# Patient Record
Sex: Male | Born: 1990 | Race: White | Hispanic: No | Marital: Married | Smoking: Never smoker
Health system: Southern US, Community
[De-identification: ages and names within clinical notes are randomized; demographics above are authoritative.]

## PROBLEM LIST (undated history)

## (undated) DIAGNOSIS — Q2112 Patent foramen ovale: Secondary | ICD-10-CM

## (undated) DIAGNOSIS — Q231 Congenital insufficiency of aortic valve: Secondary | ICD-10-CM

## (undated) DIAGNOSIS — F909 Attention-deficit hyperactivity disorder, unspecified type: Secondary | ICD-10-CM

## (undated) DIAGNOSIS — K219 Gastro-esophageal reflux disease without esophagitis: Secondary | ICD-10-CM

## (undated) DIAGNOSIS — F419 Anxiety disorder, unspecified: Secondary | ICD-10-CM

## (undated) DIAGNOSIS — R55 Syncope and collapse: Secondary | ICD-10-CM

## (undated) DIAGNOSIS — R011 Cardiac murmur, unspecified: Secondary | ICD-10-CM

## (undated) DIAGNOSIS — Q211 Atrial septal defect: Secondary | ICD-10-CM

## (undated) DIAGNOSIS — T7840XA Allergy, unspecified, initial encounter: Secondary | ICD-10-CM

## (undated) DIAGNOSIS — Q2381 Bicuspid aortic valve: Secondary | ICD-10-CM

## (undated) DIAGNOSIS — F32A Depression, unspecified: Secondary | ICD-10-CM

## (undated) HISTORY — DX: Bicuspid aortic valve: Q23.81

## (undated) HISTORY — DX: Atrial septal defect: Q21.1

## (undated) HISTORY — DX: Anxiety disorder, unspecified: F41.9

## (undated) HISTORY — DX: Syncope and collapse: R55

## (undated) HISTORY — DX: Gastro-esophageal reflux disease without esophagitis: K21.9

## (undated) HISTORY — PX: APPENDECTOMY: SHX54

## (undated) HISTORY — DX: Patent foramen ovale: Q21.12

## (undated) HISTORY — DX: Cardiac murmur, unspecified: R01.1

## (undated) HISTORY — DX: Allergy, unspecified, initial encounter: T78.40XA

## (undated) HISTORY — PX: EYE SURGERY: SHX253

## (undated) HISTORY — DX: Congenital insufficiency of aortic valve: Q23.1

---
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
## 2011-10-17 ENCOUNTER — Encounter: Payer: Self-pay | Admitting: Cardiology

## 2011-10-26 ENCOUNTER — Encounter: Payer: Self-pay | Admitting: Cardiology

## 2011-12-24 ENCOUNTER — Institutional Professional Consult (permissible substitution): Payer: Self-pay | Admitting: Cardiovascular Disease

## 2012-02-01 ENCOUNTER — Institutional Professional Consult (permissible substitution): Payer: Self-pay | Admitting: Cardiovascular Disease

## 2012-03-17 ENCOUNTER — Ambulatory Visit (INDEPENDENT_AMBULATORY_CARE_PROVIDER_SITE_OTHER): Payer: Self-pay | Admitting: Cardiology

## 2012-03-17 ENCOUNTER — Encounter: Payer: Self-pay | Admitting: Cardiology

## 2012-03-17 VITALS — BP 110/60 | HR 92 | Ht 69.0 in | Wt 137.0 lb

## 2012-03-17 DIAGNOSIS — R55 Syncope and collapse: Secondary | ICD-10-CM | POA: Insufficient documentation

## 2012-03-17 MED ORDER — METOPROLOL TARTRATE 25 MG PO TABS: 12.5000 mg | ORAL_TABLET | Freq: Two times a day (BID) | ORAL | Status: DC

## 2012-03-17 NOTE — Progress Notes (Signed)
Austin Vargas Date of Birth: 1991/07/01 Medical Record #[id number]  History of Present Illness: Austin Vargas is seen for a second opinion. He is a pleasant 21 year old white male who has a history of dizziness and recurrent syncope. His initial episode of syncope occurred while he was sitting in a classroom. He felt a tingling sensation in the back of his neck. This lasted for 20-30 minutes. He then developed a sense that he was "off". He had some mild lightheadedness. He then had a syncopal episode hitting his head on the desk and winding up on the floor. He was told that he had a concussion related to this fall. Within the same week he was at church. He was standing singing and felt a similar sensation. He sat down in his pew but then passed out. This was witnessed by his family. Since then he has had further episodes of lightheadedness without true syncope. He complains of feeling drained. He has been able to participate in some sports but does find that the longer he exercises or the hotter he gets the more lightheaded he feels. He has liberalized the salt in his diet and has focused on good hydration. He underwent extensive evaluation by [facility]. This included an ECG, Holter monitor, echocardiogram, cranial CT scan, and tilt table testing. His echocardiogram was remarkable for a PFO by bubble study. He also appeared to have a bicuspid aortic valve but had no significant gradient. His Holter monitor showed no significant arrhythmias. His tilt table test was reported as being markedly abnormal and consistent with neurocardiogenic syncope but those results are not available today. Patient was initially started on low-dose Paxil but did not tolerate this well with resultant hallucinations. He was then tried on midodrine without any improvement in symptoms and with the side effect of headaches. He stop taking this medication and one week ago.  Current Outpatient Prescriptions on File  Prior to Visit  Medication Sig Dispense Refill  . metoprolol tartrate (LOPRESSOR) 25 MG tablet Take 0.5 tablets (12.5 mg total) by mouth 2 (two) times daily.  30 tablet  11    Allergies  Allergen Reactions  . Paxil (Paroxetine Hcl)     Past Medical History  Diagnosis Date  . Lightheadedness   . Fainting 1/28 and 10/15/2011  . Neurologic cardiac syncope     Past Surgical History  Procedure Date  . Appendectomy   . Eye surgery     Both eyes    History  Smoking status  . Never Smoker   Smokeless tobacco  . Not on file    History  Alcohol Use No    History reviewed. No pertinent family history.  Review of Systems: As noted in history of present illness.  All other systems were reviewed and are negative.  Physical Exam: BP 110/60  Pulse 92  Ht 5\' 9"  (1.753 m)  Wt 62.143 kg (137 lb)  BMI 20.23 kg/m2  SpO2 99% Orthostatic vital signs reveal a blood pressure of 112/62 and pulse of 80 supine, blood pressure 116/70 and pulse of 88 sitting, and blood pressure 114/80 with pulse of 100 standing. He is a young, thin white male in no acute distress.The patient is alert and oriented x 3.  The mood and affect are normal.  The skin is warm and dry.  Color is normal.  The HEENT exam reveals that the sclera are nonicteric.  The mucous membranes are moist.  The carotids are 2+ without bruits.  There is no  thyromegaly.  There is no JVD.  The lungs are clear.  The chest wall is non tender.  The heart exam reveals a regular rate with a normal S1 and S2.  There are no murmurs, gallops, or rubs.  The PMI is not displaced.   Abdominal exam reveals good bowel sounds.  There is no guarding or rebound.  There is no hepatosplenomegaly or tenderness.  There are no masses.  Exam of the legs reveal no clubbing, cyanosis, or edema.  The legs are without rashes.  The distal pulses are intact.  Cranial nerves II - XII are intact.  Motor and sensory functions are intact.  The gait is normal.  LABORATORY  DATA: ECG demonstrates normal sinus rhythm with marked sinus arrhythmia. It is otherwise normal.  Assessment / Plan:

## 2012-03-17 NOTE — Patient Instructions (Signed)
Continue your current prevention measures- hydration, liberalize salt intake, early recognition  We will try you on metoprolol 12.5 mg twice a day.  We will schedule you to see Dr. Graciela Husbands to follow up.

## 2012-03-17 NOTE — Assessment & Plan Note (Signed)
His symptom complex is certainly consistent with neurocardiogenic syncope and his tilt table test was apparently consistent with this. We discussed the pathophysiology of this syndrome. He has failed treatment with Paxil and midodrine. I requested a complete copy of his tilt table test. He is to continue his avoidance measures including hydration, liberalization assault, and earlier a condition of symptoms. I recommended a trial of low-dose beta blocker therapy and we'll start him on metoprolol 12.5 mg twice daily. I will have him follow up with Dr. Graciela Husbands who has a particular interest in this disease process.

## 2012-04-19 ENCOUNTER — Telehealth: Payer: Self-pay | Admitting: Cardiology

## 2012-04-19 NOTE — Telephone Encounter (Signed)
Pt had to change appt and move it to October to see Dr. Graciela Husbands due to school schedule and wanted to make sure that if he needs more meds prior to appt will Dr. Swaziland get it called in for him

## 2012-04-20 NOTE — Telephone Encounter (Signed)
Spoke to patient's mother was told if patient has any problems and needs any refills she can call me.Stated since son started on new medicine he is doing better.

## 2012-05-11 ENCOUNTER — Ambulatory Visit: Payer: 59 | Admitting: Internal Medicine

## 2012-05-31 ENCOUNTER — Encounter: Payer: Self-pay | Admitting: Physician Assistant

## 2012-05-31 ENCOUNTER — Ambulatory Visit (INDEPENDENT_AMBULATORY_CARE_PROVIDER_SITE_OTHER): Payer: 59 | Admitting: Physician Assistant

## 2012-05-31 ENCOUNTER — Telehealth: Payer: Self-pay | Admitting: *Deleted

## 2012-05-31 VITALS — BP 134/86 | HR 103 | Ht 70.0 in | Wt 138.8 lb

## 2012-05-31 DIAGNOSIS — R55 Syncope and collapse: Secondary | ICD-10-CM

## 2012-05-31 MED ORDER — PROPRANOLOL HCL 10 MG PO TABS: 10.0000 mg | ORAL_TABLET | Freq: Three times a day (TID) | ORAL | Status: DC

## 2012-05-31 MED ORDER — ATENOLOL 25 MG PO TABS: 12.5000 mg | ORAL_TABLET | Freq: Every day | ORAL | Status: DC

## 2012-05-31 MED ORDER — NADOLOL 20 MG PO TABS: 10.0000 mg | ORAL_TABLET | Freq: Every day | ORAL | Status: DC

## 2012-05-31 NOTE — Telephone Encounter (Signed)
T/W pharmicist only suppose to give one beta blocker at a time pharmicist verbally understood and noted it chart

## 2012-05-31 NOTE — Patient Instructions (Addendum)
Take metoprolol tonight    Then start with one of these prescriptions at a time and see which one works better for you; call back with which one makes you feel better so we can send in a prescription Your physician has recommended you make the following change in your medication:  1.) ATENOLOL (25 MG) TAKE ONE HALF TABLET (12.5 MG) DAILY FOR TWO WEEKS 2.) PROPRANOLOL (10MG )  TAKE ONE TABLET 3 TIMES DAILY FOR TWO WEEKS 3.) NADOLOL (20 MG) TAKE ONE HALF TABLET (10 MG) DAILY FOR TWO WEEKS   Your physician recommends that you KEEP follow-up appointment WITH DR. Graciela Husbands ON 06/23/2012

## 2012-05-31 NOTE — Progress Notes (Signed)
[address] [place], [place]  [address] Phone: [phone] Fax:  [phone]  Date:  05/31/2012   Name:  Austin Vargas   DOB:  05/13/1991   MRN:  [id number]  PCP:  No primary provider on file.  Primary Cardiologist:  Dr. Peter Swaziland  Primary Electrophysiologist:  None - Has appt scheduled with Dr. Sherryl Manges in 06/2012   History of Present Illness: Austin Vargas is a 21 y.o. male who returns for evaluation of dizziness.  He has a history of dizziness and recurrent syncope. He saw Dr. Peter Swaziland in 7/13 for second opinion evaluation.  He underwent extensive evaluation by [facility]. This included an ECG, Holter monitor, echocardiogram, cranial CT scan, and tilt table testing. His echocardiogram was remarkable for a PFO by bubble study. He also appeared to have a bicuspid aortic valve but had no significant gradient. His Holter monitor showed no significant arrhythmias. His tilt table test was reported as being markedly abnormal and consistent with neurocardiogenic syncope but those results have not been available. Patient was initially started on low-dose Paxil but did not tolerate this well with resultant hallucinations. He was then tried on midodrine without any improvement in symptoms and with the side effect of headaches. He stopped taking this medication.  Dr. Swaziland discussed hydration and liberalization of salt and a trial of a low dose beta blocker (Metoprolol 12.5 mg bid).  He also referred him to Dr. Sherryl Manges for further evaluation and recommendations.  Over the weekend, he developed lightheadedness shortly after getting out of a hot shower.  He felt near syncopal but did not pass out.  He felt rapid palpitations. He laid down for almost an hour before feeling better. He denies chest pain or shortness of breath. Of note, over the last couple days he has had symptoms of a viral illness. He's had diarrhea and an upset  stomach. He denies fevers, cough, sore throat or otalgia.  Past Medical History  Diagnosis Date  . Neurologic cardiac syncope     He underwent extensive evaluation by [facility]. This included an ECG, Holter monitor, echocardiogram, cranial CT scan, and tilt table testing. His echocardiogram was remarkable for a PFO by bubble study. He also appeared to have a bicuspid aortic valve but had no significant gradient.  Holter neg for significant arrhythmias. Tilt test + for neurocardiogenic syncope    Current Outpatient Prescriptions  Medication Sig Dispense Refill  . metoprolol tartrate (LOPRESSOR) 25 MG tablet Take 0.5 tablets (12.5 mg total) by mouth 2 (two) times daily.  30 tablet  11  . Multiple Vitamin (MULTIVITAMIN) tablet Take 1 tablet by mouth daily.        Allergies: Allergies  Allergen Reactions  . Paxil (Paroxetine Hcl)     History  Substance Use Topics  . Smoking status: Never Smoker   . Smokeless tobacco: Not on file  . Alcohol Use: No     ROS:  Please see the history of present illness.   He notes problems with erectile dysfunction since starting metoprolol.  All other systems reviewed and negative.   PHYSICAL EXAM: VS:  BP 114/69  Pulse 83  Ht 5\' 10"  (1.778 m)  Wt 138 lb 12.8 oz (62.959 kg)  BMI 19.92 kg/m2   Filed Vitals:   05/31/12 0926 05/31/12 0927 05/31/12 0928 05/31/12 0929  BP: 123/77 127/75 124/85 134/86  Pulse: 88 95 101 103  Height:      Weight:  Well nourished, well developed, in no acute distress HEENT: normal Neck: no JVD Endo: no thyromegaly  Cardiac:  normal S1, S2; RRR; no murmur Lungs:  clear to auscultation bilaterally, no wheezing, rhonchi or rales Abd: soft, nontender, no hepatomegaly Ext: no edema Skin: warm and dry Neuro:  CNs 2-12 intact, no focal abnormalities noted  EKG:  Sinus rhythm, heart rate 77, rightward axis, PACs      ASSESSMENT AND PLAN:  1. Neurocardiogenic Syncope:  I believe that  his episode this past weekend was likely exacerbated by his recent viral illness. We discussed the nature of his problem. He probably became somewhat dehydrated with the diarrhea which caused his symptoms to worsen. Overall, his symptoms have been better since he has been on metoprolol. However, he is somewhat troubled by erectile dysfunction. I have given him the option of a beta blocker trial with 3 different drugs. I have given him atenolol 25 mg half tablet daily, nadolol 20 mg half a tablet daily and propranolol 10 mg 3 times a day. We discussed how to try these. He did ask about whether or not he should have a handicap sticker. I told him that this would not be a very good idea. He also asked about medications to treat erectile dysfunction. I explained that finding a beta blocker that works without the side effects would be a better option. He already has a followup with Dr. Graciela Husbands in October and he should keep that appointment.  Luna Glasgow, PA-C  8:41 AM 05/31/2012

## 2012-06-13 ENCOUNTER — Other Ambulatory Visit: Payer: Self-pay | Admitting: Cardiology

## 2012-06-13 DIAGNOSIS — R55 Syncope and collapse: Secondary | ICD-10-CM

## 2012-06-13 NOTE — Telephone Encounter (Signed)
Pt needs refill for 1 month supply

## 2012-06-14 ENCOUNTER — Other Ambulatory Visit: Payer: Self-pay | Admitting: *Deleted

## 2012-06-14 ENCOUNTER — Telehealth: Payer: Self-pay | Admitting: Cardiology

## 2012-06-14 ENCOUNTER — Other Ambulatory Visit: Payer: Self-pay | Admitting: Cardiology

## 2012-06-14 DIAGNOSIS — R55 Syncope and collapse: Secondary | ICD-10-CM

## 2012-06-14 MED ORDER — ATENOLOL 25 MG PO TABS: 12.5000 mg | ORAL_TABLET | Freq: Every day | ORAL | Status: DC

## 2012-06-14 NOTE — Telephone Encounter (Signed)
Spoke with pt who has decided atenolol 25mg  1/2 tab qd--#15 with 12 refills sent to pharmacy

## 2012-06-14 NOTE — Telephone Encounter (Signed)
Pt is completely out of medication and this is the 3rd call for a refill

## 2012-06-14 NOTE — Telephone Encounter (Signed)
Pt out of atenolol and needs called into [facility] [place] asap

## 2012-06-14 NOTE — Telephone Encounter (Signed)
Received call from patient's mother stating patient likes atenolol it is working.States he needs a refill.Refill sent to [facility] [address] [place].

## 2012-06-23 ENCOUNTER — Encounter: Payer: Self-pay | Admitting: Internal Medicine

## 2012-06-23 ENCOUNTER — Ambulatory Visit (INDEPENDENT_AMBULATORY_CARE_PROVIDER_SITE_OTHER): Payer: 59 | Admitting: Internal Medicine

## 2012-06-23 VITALS — BP 110/72 | HR 72 | Resp 16 | Ht 69.0 in | Wt 138.0 lb

## 2012-06-23 DIAGNOSIS — R55 Syncope and collapse: Secondary | ICD-10-CM

## 2012-06-23 NOTE — Patient Instructions (Addendum)
Your physician recommends that you schedule a follow-up appointment in: 3 months with Dr Klein  

## 2012-06-23 NOTE — Progress Notes (Signed)
ELECTROPHYSIOLOGY CONSULT NOTE  Patient ID: Austin Vargas, MRN: [id number], DOB/AGE: 04-02-1991 20 y.o. Admit date: (Not on file) Date of Consult: 06/23/2012  Primary Physician: Nadean Corwin, MD Primary Cardiologist:  PJ    Chief Complaint  HPI Austin Vargas is a 21 y.o. male  Seen at the request of Dr. Garth Bigness because of a history of syncope diagnosed at [facility] as neurocardiogenic. This is based on a tilt table test associated with cardio inhibitory/vasopressor signs. Therapy was initiated with Paxil and then metoprolol was taken; he is currently on low-dose atenolol and feeling some better.  The symptoms started abruptly in February. They're associated with a typical prodrome of flushing diaphoresis and some nausea accompanied by lightheadedness and preceded by tingling in his shoulder and his neck. He is become aware of the duration of the prodrome lasting minutes. Initially he was unaware of same and had a number of episodes of loss of consciousness.  He is found that he is to shower intolerance, heat intolerance, he is some orthostatic intolerance and his symptoms are worse in the morning.  His diet is salt and water replete.   he denies use of alcohol or recreational drugs.  He no longer smokes    Past Medical History  Diagnosis Date  . Neurologic cardiac syncope     He underwent extensive evaluation by [facility]. This included an ECG, Holter monitor, echocardiogram, cranial CT scan, and tilt table testing. His echocardiogram was remarkable for a PFO by bubble study. He also appeared to have a bicuspid aortic valve but had no significant gradient.  Holter neg for significant arrhythmias. Tilt test + for neurocardiogenic syncope      Surgical History:  Past Surgical History  Procedure Date  . Appendectomy   . Eye surgery     Both eyes     Home Meds: Prior to Admission medications   Medication Sig Start Date End Date  Taking? Authorizing Provider  atenolol (TENORMIN) 25 MG tablet Take 0.5 tablets (12.5 mg total) by mouth daily. 06/14/12   Austin M Swaziland, MD  metoprolol tartrate (LOPRESSOR) 25 MG tablet Take 0.5 tablets (12.5 mg total) by mouth 2 (two) times daily. 03/17/12 03/17/13  Austin M Swaziland, MD  nadolol (CORGARD) 20 MG tablet Take 0.5 tablets (10 mg total) by mouth daily. 05/31/12   Beatrice Lecher, PA  propranolol (INDERAL) 10 MG tablet Take 1 tablet (10 mg total) by mouth 3 (three) times daily. 05/31/12   Beatrice Lecher, PA    I Allergies:  Allergies  Allergen Reactions  . Paxil (Paroxetine Hcl)     History   Social History  . Marital Status: Single    Spouse Name: N/A    Number of Children: N/A  . Years of Education: N/A   Occupational History  . college Curator   Social History Main Topics  . Smoking status: Former Smoker    Start date: 11/12/2011  . Smokeless tobacco: Not on file  . Alcohol Use: No  . Drug Use: No  . Sexually Active: Not on file   Other Topics Concern  . Not on file   Social History Narrative  . No narrative on file     No family history on file.   ROS:  Please see the history of present illness.     All other systems reviewed and negative.    Physical Exam: BP 110/72  Pulse 72  Resp 16  Ht 5\' 9"  (1.753 m)  Wt 138 lb (62.596 kg)  BMI 20.38 kg/m2  General: Well developed, well nourished male in no acute distress. Head: Normocephalic, atraumatic, sclera non-icteric, no xanthomas, nares are without discharge. Lymph Nodes:  none Back: without scoliosis/kyphosis, no CVA tendersness Neck: Negative for carotid bruits. JVD not elevated. Lungs: Clear bilaterally to auscultation without wheezes, rales, or rhonchi. Breathing is unlabored. Heart: RRR with S1 S2. No murmur , rubs, or gallops appreciated. Abdomen: Soft, non-tender, non-distended with normoactive bowel sounds. No hepatomegaly. No rebound/guarding. No obvious abdominal  masses. Msk:  Strength and tone appear normal for age. Extremities: No clubbing or cyanosis. No dema.  Distal pedal pulses are 2+ and equal bilaterally. Skin: Warm and Dry Neuro: Alert and oriented X 3. CN III-XII intact Grossly normal sensory and motor function . Psych:  Responds to questions appropriately with a normal affect.      EKG:  normal   Assessment and Plan:   Sherryl Manges

## 2012-06-23 NOTE — Assessment & Plan Note (Signed)
The patient has neurocardiogenic syncope by history with findings supported by abnormal tilt table testing with a mixed cardio inhibition/vasodepressor response. SSRI therapy was attempted but not successful. He is currently on low-dose beta blockers and is tolerating atenolol. He's had no significant symptoms of late. We had an extensive discussion regarding the physiology and his potential relationship to a post viral illness especially given the abruptness of its onset in February. We also discussed extensively the benefits of aerobic exercise, the potential deleterious impact of and aerobic exercise, infections and other illnesses, and the therapeutic effects of salt and water repletion. We discussed the impact of heat and prolonged standing. I gave him website information from [employer] and [email or web address]

## 2012-07-24 ENCOUNTER — Telehealth: Payer: Self-pay | Admitting: Cardiology

## 2012-07-24 NOTE — Telephone Encounter (Signed)
Pt is having trouble sleeping and they want to know what can be mixed with atenolol

## 2012-07-24 NOTE — Telephone Encounter (Signed)
Spoke to patient's mother she stated patient is having trouble sleeping.States she thinks it might be atenolol causing.Stated Dr.Klein gave patient another beta blocker to try.Stated will stop atenolol and start the other beta blocker prescription.Advised to call back if needed.

## 2012-07-25 ENCOUNTER — Telehealth: Payer: Self-pay | Admitting: Internal Medicine

## 2012-07-25 NOTE — Telephone Encounter (Signed)
Advised pt to take 12.5-25mg  of Benadryl to help with insomnia.  Pt will report back if not working.

## 2012-07-25 NOTE — Telephone Encounter (Signed)
Pt can not take atenolol and they need something called into [facility] in [place] [address] to help him sleep. She discuss this with a nurse yesterday and the mother thought they had something at home for the pt to take and when they looked last night they did not have anything

## 2012-09-08 ENCOUNTER — Telehealth: Payer: Self-pay | Admitting: Internal Medicine

## 2012-09-08 NOTE — Telephone Encounter (Signed)
Mr Quintanar wanted to know if he can take a multi symptom mucinex.  I told him to avoid decongestants.

## 2012-09-08 NOTE — Telephone Encounter (Signed)
New Problem:    Patient called in with a question about taking Mucinex.  Please call back.

## 2012-09-11 ENCOUNTER — Telehealth: Payer: Self-pay | Admitting: Internal Medicine

## 2012-09-11 NOTE — Telephone Encounter (Signed)
Pt needs something to take for a cold and mom wants to know can he take robitussin

## 2012-09-11 NOTE — Telephone Encounter (Signed)
Left message for pts mother, okay given for robitussin, mucinex, or claratin for his cold. To call with further questions.

## 2012-09-22 ENCOUNTER — Ambulatory Visit: Payer: 59 | Admitting: Internal Medicine

## 2012-09-22 ENCOUNTER — Ambulatory Visit (INDEPENDENT_AMBULATORY_CARE_PROVIDER_SITE_OTHER): Payer: 59 | Admitting: Internal Medicine

## 2012-09-22 ENCOUNTER — Encounter: Payer: Self-pay | Admitting: Internal Medicine

## 2012-09-22 VITALS — BP 119/73 | HR 84 | Ht 68.0 in | Wt 135.4 lb

## 2012-09-22 DIAGNOSIS — R55 Syncope and collapse: Secondary | ICD-10-CM

## 2012-09-22 NOTE — Assessment & Plan Note (Signed)
Improved. We'll continue salt. We'll see him at the beginning of the summer. We again discussed the importance of being cognizant of the impact of heat in his symptoms.Marland Kitchen

## 2012-09-22 NOTE — Patient Instructions (Signed)
Your physician wants you to follow-up in: 6 months with Dr. Klein. You will receive a reminder letter in the mail two months in advance. If you don't receive a letter, please call our office to schedule the follow-up appointment.  Your physician recommends that you continue on your current medications as directed. Please refer to the Current Medication list given to you today.  

## 2012-09-22 NOTE — Progress Notes (Signed)
Patient Care Team: Lucky Cowboy, MD as PCP - General (Internal Medicine)   HPI  Austin Vargas is a 22 y.o. male Seen in followup for neurocardiogenic syncope treated with betablockerss  He is better with the salt supplementation related to his dizziness. He is tolerating without GI distress. Urine color is clear. There is no significant fatigue  Past Medical History  Diagnosis Date  . Syncope -neurocardiogenic     He underwent extensive evaluation by [facility]. This included an ECG, Holter monitor, echocardiogram, cranial CT scan, and tilt table testing. His echocardiogram was remarkable for a PFO by bubble study. He also appeared to have a bicuspid aortic valve but had no significant gradient.  Holter neg for significant arrhythmias. Tilt test + for neurocardiogenic syncope    Past Surgical History  Procedure Date  . Appendectomy   . Eye surgery     Both eyes    Current Outpatient Prescriptions  Medication Sig Dispense Refill  . atenolol (TENORMIN) 25 MG tablet Take 0.5 tablets (12.5 mg total) by mouth daily.  15 tablet  6    Allergies  Allergen Reactions  . Paxil (Paroxetine Hcl)     Review of Systems negative except from HPI and PMH  Physical Exam BP 119/73  Pulse 84  Ht 5\' 8"  (1.727 m)  Wt 135 lb 6.4 oz (61.417 kg)  BMI 20.59 kg/m2 Well developed and nourished in no acute distress HENT normal Neck supple with JVP-flat Clear Regular rate and rhythm, no murmurs or gallops Abd-soft with active BS No Clubbing cyanosis edema Skin-warm and dry A & Oriented  Grossly normal sensory and motor function     Assessment and  Plan

## 2012-09-27 ENCOUNTER — Ambulatory Visit: Payer: 59 | Admitting: Internal Medicine

## 2012-11-03 ENCOUNTER — Ambulatory Visit: Payer: 59 | Admitting: Internal Medicine

## 2013-02-17 DIAGNOSIS — J329 Chronic sinusitis, unspecified: Secondary | ICD-10-CM | POA: Insufficient documentation

## 2013-02-17 DIAGNOSIS — Z0000 Encounter for general adult medical examination without abnormal findings: Secondary | ICD-10-CM | POA: Insufficient documentation

## 2013-03-01 DIAGNOSIS — R197 Diarrhea, unspecified: Secondary | ICD-10-CM | POA: Insufficient documentation

## 2013-03-22 ENCOUNTER — Ambulatory Visit (INDEPENDENT_AMBULATORY_CARE_PROVIDER_SITE_OTHER): Payer: 59 | Admitting: Internal Medicine

## 2013-03-22 ENCOUNTER — Encounter: Payer: Self-pay | Admitting: Internal Medicine

## 2013-03-22 VITALS — BP 114/75 | HR 77 | Ht 70.0 in | Wt 137.6 lb

## 2013-03-22 DIAGNOSIS — R55 Syncope and collapse: Secondary | ICD-10-CM

## 2013-03-22 NOTE — Patient Instructions (Signed)
Your physician wants you to follow-up in: 6 months with Dr. Klein. You will receive a reminder letter in the mail two months in advance. If you don't receive a letter, please call our office to schedule the follow-up appointment.  

## 2013-03-22 NOTE — Progress Notes (Signed)
Patient Care Team: Lucky Cowboy, MD as PCP - General (Internal Medicine)   HPI  Austin Vargas is a 22 y.o. male Seen in followup for neurocardiogenic syncope being treated with salt supplementation fluid repletion  He has complaints of chest pain. These are 3 or 4 weeks making. They are described as underneath his left breast. Occasionally they're noted in his right shoulder. They are unrelated to exertion. The last 3-6 hours at a time. There are no aggravating factors; perhaps breathing makes it feel better.  He is also has some dizziness. This is relatively persistent. It doesn't make with lying or sitting. His volume is quite replete and is taking salt tablets one a day. He is now working in the in the seated at [employer]   Past Medical History  Diagnosis Date  . Syncope -neurocardiogenic     He underwent extensive evaluation by [facility]. This included an ECG, Holter monitor, echocardiogram, cranial CT scan, and tilt table testing. His echocardiogram was remarkable for a PFO by bubble study. He also appeared to have a bicuspid aortic valve but had no significant gradient.  Holter neg for significant arrhythmias. Tilt test + for neurocardiogenic syncope    Past Surgical History  Procedure Laterality Date  . Appendectomy    . Eye surgery      Both eyes    Current Outpatient Prescriptions  Medication Sig Dispense Refill  . sodium chloride 1 G tablet Take 1 g by mouth 3 (three) times daily.       No current facility-administered medications for this visit.    Allergies  Allergen Reactions  . Paxil (Paroxetine Hcl)     Review of Systems negative except from HPI and PMH  Physical Exam BP 113/74  Pulse 59  Ht 5\' 10"  (1.778 m)  Wt 137 lb 9.6 oz (62.415 kg)  BMI 19.74 kg/m2 Well developed and nourished in no acute distress HENT normal Neck supple with JVP-flat Clear Regular rate and rhythm, no murmurs or gallops Abd-soft with active  BS No Clubbing cyanosis edema Skin-warm and dry A & Oriented  Grossly normal sensory and motor function  ECG demonstrates sinus rhythm at 53 Intervals 15/11/41 Axis LXXXVI Assessment and  Plan   Neurocardiogenic syncope He continues with some symptoms of dizziness. He is doing a good job with volume repletion. We'll increase his sodium repletion.  We have talked about the importance of avoidance of Environmental heat as much as he can  Chest pain atypical. This may well be related to his work. I don't think this is cardiac.

## 2013-07-24 ENCOUNTER — Telehealth: Payer: Self-pay | Admitting: Physician Assistant

## 2013-07-24 DIAGNOSIS — K219 Gastro-esophageal reflux disease without esophagitis: Secondary | ICD-10-CM

## 2013-07-24 DIAGNOSIS — R109 Unspecified abdominal pain: Secondary | ICD-10-CM

## 2013-07-24 NOTE — Telephone Encounter (Signed)
Pt's mom called=pt saw amanda about 2 months ago for stomach problems. On Sunday pt hurt so bad in stomach area he said it felt like a heart attack wenr to [facility] on Sunday=they just gave him pain meds and said to follow up with his md Their question is should they see Marchelle Folks again or go see a gi doctor? Please call mom=sandy at [phone] Sending chart back to you.

## 2013-07-24 NOTE — Telephone Encounter (Signed)
Please inform patient to follow up with GI doctor if they need a referral we can do this.

## 2013-07-25 ENCOUNTER — Encounter: Payer: Self-pay | Admitting: Internal Medicine

## 2013-07-25 MED ORDER — OMEPRAZOLE 40 MG PO CPDR: 40.0000 mg | DELAYED_RELEASE_CAPSULE | Freq: Two times a day (BID) | ORAL | Status: DC

## 2013-07-25 NOTE — Telephone Encounter (Signed)
Spoke with mom Dois Davenport and advised her that per Marchelle Folks he needs to see GI and we will get a referral to [facility] done, he also needs to take his Prilosec twice daily per Marchelle Folks and a new RX was sent to his pharmacy

## 2013-08-20 ENCOUNTER — Encounter: Payer: Self-pay | Admitting: Internal Medicine

## 2013-08-22 ENCOUNTER — Ambulatory Visit (INDEPENDENT_AMBULATORY_CARE_PROVIDER_SITE_OTHER): Payer: 59 | Admitting: Internal Medicine

## 2013-08-22 ENCOUNTER — Other Ambulatory Visit (INDEPENDENT_AMBULATORY_CARE_PROVIDER_SITE_OTHER): Payer: 59

## 2013-08-22 ENCOUNTER — Encounter: Payer: Self-pay | Admitting: Internal Medicine

## 2013-08-22 VITALS — BP 110/68 | HR 80 | Ht 69.0 in | Wt 142.8 lb

## 2013-08-22 DIAGNOSIS — K589 Irritable bowel syndrome without diarrhea: Secondary | ICD-10-CM

## 2013-08-22 DIAGNOSIS — K219 Gastro-esophageal reflux disease without esophagitis: Secondary | ICD-10-CM

## 2013-08-22 DIAGNOSIS — R195 Other fecal abnormalities: Secondary | ICD-10-CM

## 2013-08-22 DIAGNOSIS — E739 Lactose intolerance, unspecified: Secondary | ICD-10-CM

## 2013-08-22 LAB — COMPREHENSIVE METABOLIC PANEL
AST: 20 U/L (ref 0–37)
BUN: 14 mg/dL (ref 6–23)
CO2: 28 mEq/L (ref 19–32)
Calcium: 9.3 mg/dL (ref 8.4–10.5)
Chloride: 104 mEq/L (ref 96–112)
Creatinine, Ser: 0.9 mg/dL (ref 0.4–1.5)
GFR: 114.67 mL/min (ref >60.00)
Sodium: 140 mEq/L (ref 135–145)
Total Protein: 7.3 g/dL (ref 6.0–8.3)

## 2013-08-22 LAB — CBC
Hemoglobin: 14.9 g/dL (ref 13.0–17.0)
MCHC: 34.3 g/dL (ref 30.0–36.0)
Platelets: 301 10*3/uL (ref 150.0–400.0)

## 2013-08-22 LAB — IGA: IgA: 44 mg/dL — ABNORMAL LOW (ref 68–378)

## 2013-08-22 LAB — SEDIMENTATION RATE: Sed Rate: 2 mm/hr (ref 0–22)

## 2013-08-22 MED ORDER — RESTORA PO CAPS: 1.0000 | ORAL_CAPSULE | Freq: Every day | ORAL | Status: DC

## 2013-08-22 MED ORDER — FAMOTIDINE 40 MG PO TABS: 40.0000 mg | ORAL_TABLET | Freq: Every day | ORAL | Status: DC

## 2013-08-22 NOTE — Patient Instructions (Signed)
Discontinue omeprazole  You can take pepcid OTC as needed.  We have sent the following medications to your pharmacy for you to pick up at your convenience: restora daily.   Take lactaide tablets with any meal containing lactose.     follow up with Dr. Rhea Belton in office in 6 weeks                                               We are excited to introduce MyChart, a new best-in-class service that provides you online access to important information in your electronic medical record. We want to make it easier for you to view your health information - all in one secure location - when and where you need it. We expect MyChart will enhance the quality of care and service we provide.  When you register for MyChart, you can:    View your test results.    Request appointments and receive appointment reminders via email.    Request medication renewals.    View your medical history, allergies, medications and immunizations.    Communicate with your physician's office through a password-protected site.    Conveniently print information such as your medication lists.  To find out if MyChart is right for you, please talk to a member of our clinical staff today. We will gladly answer your questions about this free health and wellness tool.  If you are age 21 or older and want a member of your family to have access to your record, you must provide written consent by completing a proxy form available at our office. Please speak to our clinical staff about guidelines regarding accounts for patients younger than age 12.  As you activate your MyChart account and need any technical assistance, please call the MyChart technical support line at [phone]) or email your question to [email or web address]. If you email your question(s), please include your name, a return phone number and the best time to reach you.  If you have non-urgent health-related questions, you can send a message  to our office through MyChart at [email or web address]. If you have a medical emergency, call 911.  Thank you for using MyChart as your new health and wellness resource!   MyChart licensed from [employer],  6213-0865. Patents Pending.

## 2013-08-22 NOTE — Progress Notes (Signed)
Patient ID: Austin Vargas, male   DOB: 09-07-1991, 22 y.o.   MRN: [id number] HPI: Thimothy Barretta is a 22 year old male with a past medical history of neurocardiogenic syncope and GERD who is seen in consultation at the request of Dr. Oneta Rack for evaluation of GERD.  He is here today with his mother.  He reports issues with "heartburn" on and off over the last several months. On one occasion within the past month he reports he "felt like I was having a heart attack" and was seen in the ER. Reportedly testing was unremarkable. Chest x-ray was unrevealing. He had a GI cocktail which helped a little. He does report some substernal chest burning and at times burning in the left upper quadrant. This has improved of late though he is not sure why. He was preceded taking omeprazole 40 mg once daily, which was then increased to twice daily. However, over the last 2-3 weeks he has not been using any medications because he lost his medicines. Despite being off of this medicine, he reports a good appetite. He reports he eats a very healthy at times large breakfast and lunch but is often not hungry at dinner. He denies nausea or vomiting. No dysphagia or odynophagia. Occasional water brash symptoms. Bowel movements have been slightly loose, and occasionally occur urgently postprandially. He is unsure of any specific trigger. Most recently his bowel movements have occurred one to 2 times daily and have been formed. No blood or melena.  He does occasionally have issues with dizziness and lightheadedness. He was previously taking sodium chloride tablets and tries changes position slowly, as his dizziness worsens if he stands quickly.   Past Medical History  Diagnosis Date  . Syncope -neurocardiogenic     He underwent extensive evaluation by [facility]. This included an ECG, Holter monitor, echocardiogram, cranial CT scan, and tilt table testing. His echocardiogram was remarkable for a PFO by  bubble study. He also appeared to have a bicuspid aortic valve but had no significant gradient.  Holter neg for significant arrhythmias. Tilt test + for neurocardiogenic syncope  . GERD (gastroesophageal reflux disease)     Past Surgical History  Procedure Laterality Date  . Appendectomy    . Eye surgery Bilateral     Current Outpatient Prescriptions  Medication Sig Dispense Refill  . famotidine (PEPCID) 40 MG tablet Take 1 tablet (40 mg total) by mouth daily.  30 tablet  0  . Probiotic Product (RESTORA) CAPS Take 1 capsule by mouth daily.  30 capsule  0  . sodium chloride 1 G tablet Take 1 g by mouth 3 (three) times daily.       No current facility-administered medications for this visit.    Allergies  Allergen Reactions  . Paxil [Paroxetine Hcl]   . Vicodin [Hydrocodone-Acetaminophen]     "shakes"    Family History  Problem Relation Age of Onset  . Colon cancer Neg Hx   . Breast cancer Other     maternal great grandmother  . Throat cancer Neg Hx   . Diabetes Paternal Grandmother   . Heart disease Maternal Grandmother   . Heart disease Other     maternal great grandmother    History  Substance Use Topics  . Smoking status: Former Smoker    Start date: 11/12/2011  . Smokeless tobacco: Not on file  . Alcohol Use: No    ROS: As per history of present illness, otherwise negative  BP 110/68  Pulse 80  Ht 5\' 9"  (1.753 m)  Wt 142 lb 12.8 oz (64.774 kg)  BMI 21.08 kg/m2 Constitutional: Well-developed and well-nourished. No distress. HEENT: Normocephalic and atraumatic. Oropharynx is clear and moist. No oropharyngeal exudate. Conjunctivae are normal.  No scleral icterus. Neck: Neck supple. Trachea midline. Cardiovascular: Normal rate, regular rhythm and intact distal pulses. No M/R/G Pulmonary/chest: Effort normal and breath sounds normal. No wheezing, rales or rhonchi. Abdominal: Soft, nontender, nondistended. Bowel sounds active throughout. There are no masses  palpable. No hepatosplenomegaly. Extremities: no clubbing, cyanosis, or edema Lymphadenopathy: No cervical adenopathy noted. Neurological: Alert and oriented to person place and time. Skin: Skin is warm and dry. No rashes noted. Psychiatric: Normal mood and affect. Behavior is normal.  RELEVANT LABS AND IMAGING: CBC    Component Value Date/Time   WBC 6.1 08/22/2013 0953   RBC 4.84 08/22/2013 0953   HGB 14.9 08/22/2013 0953   HCT 43.6 08/22/2013 0953   PLT 301.0 08/22/2013 0953   MCV 90.0 08/22/2013 0953   MCHC 34.3 08/22/2013 0953   RDW 12.1 08/22/2013 0953    CMP     Component Value Date/Time   NA 140 08/22/2013 0953   K 3.5 08/22/2013 0953   CL 104 08/22/2013 0953   CO2 28 08/22/2013 0953   GLUCOSE 83 08/22/2013 0953   BUN 14 08/22/2013 0953   CREATININE 0.9 08/22/2013 0953   CALCIUM 9.3 08/22/2013 0953   PROT 7.3 08/22/2013 0953   ALBUMIN 4.2 08/22/2013 0953   AST 20 08/22/2013 0953   ALT 18 08/22/2013 0953   ALKPHOS 58 08/22/2013 0953   BILITOT 1.6* 08/22/2013 0953   Celiac -- pending  Erythrocyte Sedimentation Rate     Component Value Date/Time   ESRSEDRATE 2 08/22/2013 1610    ASSESSMENT/PLAN: Zeferino Mounts is a 22 year old male with a past medical history of neurocardiogenic syncope and GERD who is seen in consultation at the request of Dr. Oneta Rack for evaluation of GERD.   1. GERD/post-prandial loose stools -- his reflux disease is occurring intermittently and is not a major problem now, despite being off of PPI therapy. We have discussed GERD hygiene, and GERD diet today at length. He was given a copy of the GERD diet today. I've also asked that he avoid eating or drinking within 90 minutes at that time. --Other questioning, it seems that he may have a lactose intolerance. He does state that she's pizza and ice cream at times make his stools more loose and urgent. He will keep a food diary and also try over-the-counter Lactaid one to 2 tablets with any  lactose containing meal. --Trial of probiotic with Restora 1 capsule daily. --OTC Prevacid per box instructions as needed for heartburn. --We also discussed how he may have irritable bowel, but I would like to perform labs today including celiac panel. Celiac panel is pending. ESR was normal, CBC normal, TSH normal, and CMP normal. Total bili was slightly elevated at 1.6. Will repeat and fractionate  He'll return in 6-8 weeks. If symptoms fail to improve we will likely reinitiate PPI and consider upper endoscopy.

## 2013-08-23 LAB — TISSUE TRANSGLUTAMINASE, IGA: Tissue Transglutaminase Ab, IgA: 2 U/mL (ref <20)

## 2013-08-27 ENCOUNTER — Telehealth: Payer: Self-pay | Admitting: *Deleted

## 2013-08-27 DIAGNOSIS — K589 Irritable bowel syndrome without diarrhea: Secondary | ICD-10-CM

## 2013-08-27 DIAGNOSIS — R109 Unspecified abdominal pain: Secondary | ICD-10-CM

## 2013-08-27 DIAGNOSIS — R195 Other fecal abnormalities: Secondary | ICD-10-CM

## 2013-08-27 DIAGNOSIS — K219 Gastro-esophageal reflux disease without esophagitis: Secondary | ICD-10-CM

## 2013-08-27 NOTE — Telephone Encounter (Signed)
Notes Recorded by Beverley Fiedler, MD on 08/23/2013 at 10:33 PM TTG is negative but IGA is low, which can make test unreliable When he returns for repeat hepatic function panel, please add tissue transglutaminase IgG antibody, also perform immunoglobulins total at that time  Informed pt of labs and that we will redraw in 4 months. Dr Rhea Belton, please tell me how to order theses labs. Thanks

## 2013-08-27 NOTE — Telephone Encounter (Signed)
Message copied by Florene Glen on Mon Aug 27, 2013  2:34 PM ------      Message from: Beverley Fiedler      Created: Wed Aug 22, 2013  5:23 PM       White count normal, ESR normal which indicates no significant inflammation, TSH normal      Celiac panel pending      Metabolic panel normal except mild elevation in bilirubin.  This does not necessarily mean anything is wrong, would repeat hepatic function panel in 4 weeks      Otherwise proceed as we discussed in clinic ------

## 2013-09-05 NOTE — Telephone Encounter (Signed)
Pt was to have a 6-8 week f/u appt. Scheduled an appt for him and mailed it.

## 2013-10-23 ENCOUNTER — Encounter: Payer: Self-pay | Admitting: Internal Medicine

## 2013-10-24 ENCOUNTER — Ambulatory Visit: Payer: 59 | Admitting: Internal Medicine

## 2014-04-23 ENCOUNTER — Ambulatory Visit (INDEPENDENT_AMBULATORY_CARE_PROVIDER_SITE_OTHER): Payer: 59 | Admitting: Physician Assistant

## 2014-04-23 ENCOUNTER — Encounter: Payer: Self-pay | Admitting: Physician Assistant

## 2014-04-23 VITALS — BP 110/72 | HR 76 | Temp 98.6°F | Resp 16 | Wt 146.0 lb

## 2014-04-23 DIAGNOSIS — G4700 Insomnia, unspecified: Secondary | ICD-10-CM

## 2014-04-23 DIAGNOSIS — K2100 Gastro-esophageal reflux disease with esophagitis, without bleeding: Secondary | ICD-10-CM

## 2014-04-23 DIAGNOSIS — R55 Syncope and collapse: Secondary | ICD-10-CM

## 2014-04-23 MED ORDER — PANTOPRAZOLE SODIUM 40 MG PO TBEC: 40.0000 mg | DELAYED_RELEASE_TABLET | Freq: Every day | ORAL | Status: DC

## 2014-04-23 MED ORDER — FLUDROCORTISONE ACETATE 0.1 MG PO TABS: 0.1000 mg | ORAL_TABLET | Freq: Every day | ORAL | Status: DC

## 2014-04-23 NOTE — Patient Instructions (Signed)
Syncope °Syncope is a medical term for fainting or passing out. This means you lose consciousness and drop to the ground. People are generally unconscious for less than 5 minutes. You may have some muscle twitches for up to 15 seconds before waking up and returning to normal. Syncope occurs more often in older adults, but it can happen to anyone. While most causes of syncope are not dangerous, syncope can be a sign of a serious medical problem. It is important to seek medical care.  °CAUSES  °Syncope is caused by a sudden drop in blood flow to the brain. The specific cause is often not determined. Factors that can bring on syncope include: °· Taking medicines that lower blood pressure. °· Sudden changes in posture, such as standing up quickly. °· Taking more medicine than prescribed. °· Standing in one place for too long. °· Seizure disorders. °· Dehydration and excessive exposure to heat. °· Low blood sugar (hypoglycemia). °· Straining to have a bowel movement. °· Heart disease, irregular heartbeat, or other circulatory problems. °· Fear, emotional distress, seeing blood, or severe pain. °SYMPTOMS  °Right before fainting, you may: °· Feel dizzy or light-headed. °· Feel nauseous. °· See all white or all black in your field of vision. °· Have cold, clammy skin. °DIAGNOSIS  °Your health care provider will ask about your symptoms, perform a physical exam, and perform an electrocardiogram (ECG) to record the electrical activity of your heart. Your health care provider may also perform other heart or blood tests to determine the cause of your syncope which may include: °· Transthoracic echocardiogram (TTE). During echocardiography, sound waves are used to evaluate how blood flows through your heart. °· Transesophageal echocardiogram (TEE). °· Cardiac monitoring. This allows your health care provider to monitor your heart rate and rhythm in real time. °· Holter monitor. This is a portable device that records your  heartbeat and can help diagnose heart arrhythmias. It allows your health care provider to track your heart activity for several days, if needed. °· Stress tests by exercise or by giving medicine that makes the heart beat faster. °TREATMENT  °In most cases, no treatment is needed. Depending on the cause of your syncope, your health care provider may recommend changing or stopping some of your medicines. °HOME CARE INSTRUCTIONS °· Have someone stay with you until you feel stable. °· Do not drive, use machinery, or play sports until your health care provider says it is okay. °· Keep all follow-up appointments as directed by your health care provider. °· Lie down right away if you start feeling like you might faint. Breathe deeply and steadily. Wait until all the symptoms have passed. °· Drink enough fluids to keep your urine clear or pale yellow. °· If you are taking blood pressure or heart medicine, get up slowly and take several minutes to sit and then stand. This can reduce dizziness. °SEEK IMMEDIATE MEDICAL CARE IF:  °· You have a severe headache. °· You have unusual pain in the chest, abdomen, or back. °· You are bleeding from your mouth or rectum, or you have black or tarry stool. °· You have an irregular or very fast heartbeat. °· You have pain with breathing. °· You have repeated fainting or seizure-like jerking during an episode. °· You faint when sitting or lying down. °· You have confusion. °· You have trouble walking. °· You have severe weakness. °· You have vision problems. °If you fainted, call your local emergency services (911 in [place] Do not drive   yourself to the hospital.  °MAKE SURE YOU: °· Understand these instructions. °· Will watch your condition. °· Will get help right away if you are not doing well or get worse. °Document Released: 08/30/2005 Document Revised: 09/04/2013 Document Reviewed: 10/29/2011 °ExitCare® Patient Information ©2015 [employer]. This information is not intended to replace  advice given to you by your health care provider. Make sure you discuss any questions you have with your health care provider. ° °

## 2014-04-23 NOTE — Progress Notes (Signed)
   Subjective:    Patient ID: Austin Vargas, male    DOB: 07/06/1991, 23 y.o.   MRN: [id number]  HPI 23 y.o. male with history of syncopal episodes, seen by Dr. Graciela HusbandsKlein and evaluated by [facility]. This included an ECG, Holter monitor, echocardiogram, cranial CT scan, and tilt table testing which was positive for neurocardiogenic syncope. His echocardiogram was remarkable for a PFO by bubble study. He also appeared to have a bicuspid aortic valve but had no significant gradient. Holter neg for significant arrhythmias. He has had very few syncopal episodes controlling it with increase fluids/sodium until this past Sunday, he has continues to have dizzy spells.  Sunday morning he woke up due to shortness of breath, hard time getting a deep breath, while he was sitting up in bed he lost consciousness for 5-10 secs, he gets up to grab a shirt feeling dizzy/room was spinning so he went back to the bed, and lost consciousness again, he woke up to call 911 and then lost consciousness again. He then went to [facility].  He was shaking with the paramedics there but was able to stop it when needed for EKG. Ddimer was negative, CXR was normal, labs were normal, EKG was normal.  He has had a headache for the past week, frontal HA. Marland Kitchen.Normally wakes up every 2-3 hours at night, occ snoring/gasping at night.   Blood pressure 110/72, pulse 76, temperature 98.6 F (37 C), resp. rate 16, weight 146 lb (66.225 kg).  Review of Systems  Constitutional: Negative.   HENT: Negative.   Respiratory: Negative.   Cardiovascular: Negative.   Gastrointestinal: Negative for nausea, vomiting, diarrhea, constipation, blood in stool, anal bleeding and rectal pain. Abdominal pain: GERD.  Musculoskeletal: Negative.   Skin: Negative.   Neurological: Positive for syncope. Negative for dizziness, tremors, seizures, facial asymmetry, speech difficulty, weakness, light-headedness, numbness and headaches.   Psychiatric/Behavioral: Positive for sleep disturbance. Negative for suicidal ideas, hallucinations, behavioral problems, confusion, self-injury, dysphoric mood, decreased concentration and agitation. The patient is nervous/anxious. The patient is not hyperactive.        Objective:   Physical Exam  Constitutional: He is oriented to person, place, and time. He appears well-developed and well-nourished.  HENT:  Head: Normocephalic and atraumatic.  Right Ear: External ear normal.  Left Ear: External ear normal.  Mouth/Throat: Oropharynx is clear and moist.  Eyes: Conjunctivae and EOM are normal. Pupils are equal, round, and reactive to light.  Neck: Normal range of motion. Neck supple.  Cardiovascular: Normal rate, regular rhythm and normal heart sounds.   Pulmonary/Chest: Effort normal and breath sounds normal.  Abdominal: Soft. Bowel sounds are normal. He exhibits no mass. There is tenderness. There is no rebound and no guarding.  Musculoskeletal: Normal range of motion.  Neurological: He is alert and oriented to person, place, and time. No cranial nerve deficit.  Skin: Skin is warm and dry.      Assessment & Plan:  Hypotension- continue to push fluids, will try florinef 0.1mg , has had a significant work up with recent labs at [facility], mother will get labs/work up and bring in-? Is anxiety related as well ? Sleep apnea- do apnea link GERD- get on protonix  Follow up in 4-6 weeks.

## 2014-06-06 ENCOUNTER — Encounter: Payer: Self-pay | Admitting: Physician Assistant

## 2014-06-06 ENCOUNTER — Ambulatory Visit (INDEPENDENT_AMBULATORY_CARE_PROVIDER_SITE_OTHER): Payer: 59 | Admitting: Physician Assistant

## 2014-06-06 VITALS — BP 120/78 | HR 88 | Temp 97.7°F | Resp 16 | Ht 68.0 in | Wt 153.0 lb

## 2014-06-06 DIAGNOSIS — Q231 Congenital insufficiency of aortic valve: Secondary | ICD-10-CM

## 2014-06-06 DIAGNOSIS — K2100 Gastro-esophageal reflux disease with esophagitis, without bleeding: Secondary | ICD-10-CM

## 2014-06-06 DIAGNOSIS — R55 Syncope and collapse: Secondary | ICD-10-CM

## 2014-06-06 DIAGNOSIS — K219 Gastro-esophageal reflux disease without esophagitis: Secondary | ICD-10-CM | POA: Insufficient documentation

## 2014-06-06 LAB — COMPREHENSIVE METABOLIC PANEL
ALK PHOS: 61 U/L (ref 39–117)
ALT: 14 U/L (ref 0–53)
AST: 17 U/L (ref 0–37)
Albumin: 4.5 g/dL (ref 3.5–5.2)
BUN: 10 mg/dL (ref 6–23)
CALCIUM: 9.7 mg/dL (ref 8.4–10.5)
CHLORIDE: 106 meq/L (ref 96–112)
CO2: 27 mEq/L (ref 19–32)
CREATININE: 0.89 mg/dL (ref 0.50–1.35)
Glucose, Bld: 100 mg/dL — ABNORMAL HIGH (ref 70–99)
Potassium: 3.8 mEq/L (ref 3.5–5.3)
Sodium: 142 mEq/L (ref 135–145)
Total Bilirubin: 1.2 mg/dL (ref 0.2–1.2)
Total Protein: 7.1 g/dL (ref 6.0–8.3)

## 2014-06-06 NOTE — Patient Instructions (Signed)

## 2014-06-06 NOTE — Progress Notes (Signed)
HPI 23 y.o.male presents for follow up for syncope, SOB, frequent awakenings. He has had a significant work up for syncope which has been normal other than a bicuspid aortic valve without a gradient, last visit he was put on florinef and he states he has not had anymore syncopal episodes. He was also complaining of waking up SOB, questionable GERD, protonix has helped some but continue to have some epigastric pain and states he has always worken up every 2-3 hours. Has not gotten sleep apnea link.   Past Medical History  Diagnosis Date  . Syncope -neurocardiogenic     He underwent extensive evaluation by [facility]. This included an ECG, Holter monitor, echocardiogram, cranial CT scan, and tilt table testing. His echocardiogram was remarkable for a PFO by bubble study. He also appeared to have a bicuspid aortic valve but had no significant gradient.  Holter neg for significant arrhythmias. Tilt test + for neurocardiogenic syncope  . GERD (gastroesophageal reflux disease)      Allergies  Allergen Reactions  . Paxil [Paroxetine Hcl]   . Vicodin [Hydrocodone-Acetaminophen]     "shakes"      Current Outpatient Prescriptions on File Prior to Visit  Medication Sig Dispense Refill  . fludrocortisone (FLORINEF) 0.1 MG tablet Take 1 tablet (0.1 mg total) by mouth daily.  30 tablet  3  . pantoprazole (PROTONIX) 40 MG tablet Take 1 tablet (40 mg total) by mouth daily.  30 tablet  3   No current facility-administered medications on file prior to visit.    ROS: all negative expect above.   Physical: Filed Weights   06/06/14 1456  Weight: 153 lb (69.4 kg)   BP 120/78  Pulse 88  Temp(Src) 97.7 F (36.5 C)  Resp 16  Ht  (1.727 m)  Wt 153 lb (69.4 kg)  BMI 23.27 kg/m2 General Appearance: Well nourished, in no apparent distress. Eyes: PERRLA, EOMs. Sinuses: No Frontal/maxillary tenderness ENT/Mouth: Ext aud canals clear, normal light reflex with TMs without  erythema, bulging. Post pharynx without erythema, swelling, exudate.  Respiratory: CTAB Cardio: RRR, no murmurs, rubs or gallops. Peripheral pulses brisk and equal bilaterally, without edema. No aortic or femoral bruits. Abdomen: Soft, with bowl sounds. Tender epigastric, no guarding, rebound. Lymphatics: Non tender without lymphadenopathy.  Musculoskeletal: Full ROM all peripheral extremities, 5/5 strength, and normal gait. Skin: Warm, dry without rashes, lesions, ecchymosis.  Neuro: Cranial nerves intact, reflexes equal bilaterally. Normal muscle tone, no cerebellar symptoms. Sensation intact.  Pysch: Awake and oriented X 3, normal affect, Insight and Judgment appropriate.   Assessment and Plan: GERD- continue protonix- will get CMET, CBC, and Hpylori.  Syncope- doing better, continue the floinef

## 2014-06-07 LAB — CBC WITH DIFFERENTIAL/PLATELET
BASOS PCT: 1 % (ref 0–1)
Basophils Absolute: 0.1 10*3/uL (ref 0.0–0.1)
EOS ABS: 0.1 10*3/uL (ref 0.0–0.7)
EOS PCT: 1 % (ref 0–5)
HEMATOCRIT: 43.5 % (ref 39.0–52.0)
Hemoglobin: 15.1 g/dL (ref 13.0–17.0)
LYMPHS PCT: 28 % (ref 12–46)
Lymphs Abs: 2.4 10*3/uL (ref 0.7–4.0)
MCH: 31.1 pg (ref 26.0–34.0)
MCHC: 34.7 g/dL (ref 30.0–36.0)
MCV: 89.5 fL (ref 78.0–100.0)
MONO ABS: 0.7 10*3/uL (ref 0.1–1.0)
Monocytes Relative: 8 % (ref 3–12)
Neutro Abs: 5.2 10*3/uL (ref 1.7–7.7)
Neutrophils Relative %: 62 % (ref 43–77)
Platelets: 290 10*3/uL (ref 150–400)
RBC: 4.86 MIL/uL (ref 4.22–5.81)
RDW: 13.3 % (ref 11.5–15.5)
WBC: 8.4 10*3/uL (ref 4.0–10.5)

## 2014-06-10 LAB — HELICOBACTER PYLORI ABS-IGG+IGA, BLD
H Pylori IgG: <0.4 {ISR}
HELICOBACTER PYLORI AB, IGA: 0.5 U/mL (ref <9.0)

## 2014-06-11 ENCOUNTER — Other Ambulatory Visit: Payer: Self-pay

## 2014-06-11 DIAGNOSIS — K219 Gastro-esophageal reflux disease without esophagitis: Secondary | ICD-10-CM

## 2014-07-10 ENCOUNTER — Encounter: Payer: Self-pay | Admitting: Internal Medicine

## 2014-07-10 ENCOUNTER — Ambulatory Visit (INDEPENDENT_AMBULATORY_CARE_PROVIDER_SITE_OTHER): Payer: 59 | Admitting: Internal Medicine

## 2014-07-10 VITALS — BP 118/76 | HR 96 | Temp 98.2°F | Resp 16 | Ht 69.0 in | Wt 150.0 lb

## 2014-07-10 DIAGNOSIS — R079 Chest pain, unspecified: Secondary | ICD-10-CM

## 2014-07-10 DIAGNOSIS — M940 Chondrocostal junction syndrome [Tietze]: Secondary | ICD-10-CM

## 2014-07-10 MED ORDER — PREDNISONE 20 MG PO TABS: ORAL_TABLET | ORAL | Status: DC

## 2014-07-10 MED ORDER — MELOXICAM 15 MG PO TABS
ORAL_TABLET | ORAL | Status: DC
Start: 2014-07-10 — End: 2014-08-12

## 2014-07-10 NOTE — Patient Instructions (Signed)
Costochondritis Costochondritis, sometimes called Tietze syndrome, is a swelling and irritation (inflammation) of the tissue (cartilage) that connects your ribs with your breastbone (sternum). It causes pain in the chest and rib area. Costochondritis usually goes away on its own over time. It can take up to 6 weeks or longer to get better, especially if you are unable to limit your activities. CAUSES  Some cases of costochondritis have no known cause. Possible causes include:  Injury (trauma).  Exercise or activity such as lifting.  Severe coughing. SIGNS AND SYMPTOMS  Pain and tenderness in the chest and rib area.  Pain that gets worse when coughing or taking deep breaths.  Pain that gets worse with specific movements. DIAGNOSIS  Your health care provider will do a physical exam and ask about your symptoms. Chest X-rays or other tests may be done to rule out other problems. TREATMENT  Costochondritis usually goes away on its own over time. Your health care provider may prescribe medicine to help relieve pain. HOME CARE INSTRUCTIONS   Avoid exhausting physical activity. Try not to strain your ribs during normal activity. This would include any activities using chest, abdominal, and side muscles, especially if heavy weights are used.  Apply ice to the affected area for the first 2 days after the pain begins.  Put ice in a plastic bag.  Place a towel between your skin and the bag.  Leave the ice on for 20 minutes, 2-3 times a day.  Only take over-the-counter or prescription medicines as directed by your health care provider. SEEK MEDICAL CARE IF:  You have redness or swelling at the rib joints. These are signs of infection.  Your pain does not go away despite rest or medicine. SEEK IMMEDIATE MEDICAL CARE IF:   Your pain increases or you are very uncomfortable.  You have shortness of breath or difficulty breathing.  You cough up blood.  You have worse chest pains,  sweating, or vomiting.  You have a fever or persistent symptoms for more than 2-3 days.  You have a fever and your symptoms suddenly get worse. MAKE SURE YOU:   Understand these instructions.  Will watch your condition.  Will get help right away if you are not doing well or get worse. Document Released: 06/09/2005 Document Revised: 06/20/2013 Document Reviewed: 04/03/2013 ExitCare Patient Information 2015 [employer]. This information is not intended to replace advice given to you by your health care provider. Make sure you discuss any questions you have with your health care provider.  

## 2014-07-10 NOTE — Progress Notes (Signed)
Patient ID: Austin Vargas, male   DOB: 10/04/1990, 23 y.o.   MRN: [id number]   This very nice 22 y.o.male with hx/o neurocardiogenic syncopeand a bicuspid AV Presents with several day hx/o non-exertional shsrl CP's in the Left para sternal area - aggravated by cough & positional changes . No sputum pdn, dyspnea, or other cardiac or respiratory sx's.   Medication Sig  . fludrocortisone (FLORINEF) 0.1 MG  Take 1 tablet (0.1 mg total) by mouth daily.  . pantoprazole  40 MG tablet Take 1 tablet (40 mg total) by mouth daily.   Allergies  Allergen Reactions  . Paxil [Paroxetine Hcl]   . Vicodin [Hydrocodone-Acetaminophen]     "shakes"   PMHx:   Past Medical History  Diagnosis Date  . Syncope -neurocardiogenic     [facility]. This included an ECG, Holter monitor, echocardiogram, cranial CT scan, and tilt table testing. His echocardiogram was remarkable for a PFO by bubble study. He also appeared to have a bicuspid aortic valve but had no significant gradient.  Holter neg for significant arrhythmias. Tilt test + for neurocardiogenic syncope  . GERD (gastroesophageal reflux disease)    Immunization History  Administered Date(s) Administered  . Tdap 09/13/2006   Past Surgical History  Procedure Laterality Date  . Appendectomy    . Eye surgery Bilateral    FHx:    Reviewed / unchanged  SHx:    Reviewed / unchanged  Systems Review:  Constitutional: Denies fever, chills, wt changes, headaches, insomnia, fatigue, night sweats, change in appetite. Eyes: Denies redness, blurred vision, diplopia, discharge, itchy, watery eyes.  ENT: Denies discharge, congestion, post nasal drip, epistaxis, sore throat, earache, hearing loss, dental pain, tinnitus, vertigo, sinus pain, snoring.  CV: Denies palpitations, irregular heartbeat, syncope, dyspnea, diaphoresis, orthopnea, PND, claudication or edema. Respiratory: denies cough, dyspnea, DOE,  pleurisy, hoarseness, laryngitis, wheezing.  Gastrointestinal: Denies dysphagia, odynophagia, heartburn, reflux, water brash, abdominal pain or cramps, nausea, vomiting, bloating, diarrhea, constipation, hematemesis, melena, hematochezia  or hemorrhoids. Genitourinary: Denies dysuria, frequency, urgency, nocturia, hesitancy, discharge, hematuria or flank pain. Musculoskeletal: Denies arthralgias, myalgias, stiffness, jt. swelling, pain, limping or strain/sprain.  Skin: Denies pruritus, rash, hives, warts, acne, eczema or change in skin lesion(s). Neuro: No weakness, tremor, incoordination, spasms, paresthesia or pain. Psychiatric: Denies confusion, memory loss or sensory loss. Endo: Denies change in weight, skin or hair change.  Heme/Lymph: No excessive bleeding, bruising or enlarged lymph nodes.  Exam:  BP 118/76  Pulse 96  Temp(Src) 98.2 F (36.8 C) (Temporal)  Resp 16  Ht 5\' 9"  (1.753 m)  Wt 150 lb (68.04 kg)  BMI 22.14 kg/m2  Appears well nourished and in no distress. Eyes: PERRLA, EOMs, conjunctiva no swelling or erythema. Sinuses: No frontal/maxillary tenderness ENT/Mouth: EAC's clear, TM's nl w/o erythema, bulging. Nares clear w/o erythema, swelling, exudates. Oropharynx clear without erythema or exudates. Oral hygiene is good. Tongue normal, non obstructing. Hearing intact.  Neck: Supple. Thyroid nl. Car 2+/2+ without bruits, nodes or JVD. There is a transmitted systolic flow M to the right carotid. Chest: Respirations nl with BS clear & equal w/o rales, rhonchi, wheezing or stridor. Moderate tenderness of left > right parasternal jt.s Cor: Heart sounds normal w/ regular rate and rhythm with a soft rt parasternal flow M and no gallops, clicks, or rubs. Peripheral pulses normal and equal  without edema.  Abdomen: Soft & benign. Musculoskeletal: Full ROM all peripheral extremities, joint stability, 5/5 strength, and normal gait.  Skin: Warm, dry without exposed rashes, lesions or  ecchymosis apparent.  Neuro: Cranial nerves intact, reflexes equal bilaterally. Sensory-motor testing grossly intact. Tendon reflexes grossly intact.  Pysch: Alert & oriented x 3.    Assessment and Plan:   1. Costochondritis  - Rx Prednisone pulse / taper over 11 days with instructions to stop in 5-7 days if and or when discomfort resolves . Also, Rx Meloxicam 15 mg to follow if needed. Lastly recommended try heating pad. Patient reassured.  2. Chest pain, unspecified   - EKG 12-Lead - WNL

## 2014-07-10 NOTE — Progress Notes (Deleted)
   Subjective:    Patient ID: Austin EvansBrandon E Munch, male    DOB: 01/27/1991, 23 y.o.   MRN: [id number]  HPI    Review of Systems     Objective:   Physical Exam        Assessment & Plan:

## 2014-07-16 ENCOUNTER — Telehealth: Payer: Self-pay | Admitting: *Deleted

## 2014-07-16 NOTE — Telephone Encounter (Signed)
Patient's mother called. Patient is having palpitations since being on Prednisone.  Per Dr Oneta RackMcKeown, palpitations caused by Prednisone are not the type to worry about.  They should stop when he is finished with the Prednisone.

## 2014-07-25 ENCOUNTER — Telehealth: Payer: Self-pay | Admitting: *Deleted

## 2014-07-25 MED ORDER — ALPRAZOLAM 1 MG PO TABS: 1.0000 mg | ORAL_TABLET | Freq: Every evening | ORAL | Status: DC | PRN

## 2014-07-25 NOTE — Telephone Encounter (Signed)
Patient's mom, Dois DavenportSandra, called and left message with the front staff.  I received a written message stating patient is still having heart palpitations at night upon laying down.  Per Dr. Kathryne SharperMcKeown's orders advised Dois DavenportSandra that he suggests we call in Xanax 1 mg and start with 1/2 qhs to see if any relief with sympoms.  May increase to 1 whole pill if not much relief.  Patient's mom wants to try this option and will call with results.

## 2014-08-01 ENCOUNTER — Ambulatory Visit: Payer: Self-pay | Admitting: Emergency Medicine

## 2014-08-02 ENCOUNTER — Encounter: Payer: Self-pay | Admitting: *Deleted

## 2014-08-06 ENCOUNTER — Other Ambulatory Visit: Payer: Self-pay | Admitting: Physician Assistant

## 2014-08-06 MED ORDER — FLUDROCORTISONE ACETATE 0.1 MG PO TABS: 0.1000 mg | ORAL_TABLET | Freq: Two times a day (BID) | ORAL | Status: DC

## 2014-08-12 ENCOUNTER — Other Ambulatory Visit: Payer: Self-pay | Admitting: *Deleted

## 2014-08-12 MED ORDER — PANTOPRAZOLE SODIUM 40 MG PO TBEC: 40.0000 mg | DELAYED_RELEASE_TABLET | Freq: Every day | ORAL | Status: DC

## 2014-08-12 MED ORDER — MELOXICAM 15 MG PO TABS: ORAL_TABLET | ORAL | Status: DC

## 2014-08-12 MED ORDER — FLUDROCORTISONE ACETATE 0.1 MG PO TABS: 0.1000 mg | ORAL_TABLET | Freq: Two times a day (BID) | ORAL | Status: DC

## 2014-08-29 ENCOUNTER — Ambulatory Visit (INDEPENDENT_AMBULATORY_CARE_PROVIDER_SITE_OTHER): Payer: 59 | Admitting: Physician Assistant

## 2014-08-29 ENCOUNTER — Encounter: Payer: Self-pay | Admitting: Physician Assistant

## 2014-08-29 VITALS — BP 112/64 | HR 92 | Temp 98.6°F | Resp 16 | Ht 69.0 in | Wt 153.0 lb

## 2014-08-29 DIAGNOSIS — Q231 Congenital insufficiency of aortic valve: Secondary | ICD-10-CM

## 2014-08-29 DIAGNOSIS — M940 Chondrocostal junction syndrome [Tietze]: Secondary | ICD-10-CM

## 2014-08-29 DIAGNOSIS — R002 Palpitations: Secondary | ICD-10-CM

## 2014-08-29 DIAGNOSIS — R519 Headache, unspecified: Secondary | ICD-10-CM

## 2014-08-29 DIAGNOSIS — R51 Headache: Secondary | ICD-10-CM

## 2014-08-29 MED ORDER — PREDNISONE 10 MG PO TABS
ORAL_TABLET | ORAL | Status: AC
Start: 2014-08-29 — End: 2014-09-04

## 2014-08-29 MED ORDER — ATENOLOL 50 MG PO TABS: 50.0000 mg | ORAL_TABLET | Freq: Every day | ORAL | Status: DC

## 2014-08-29 NOTE — Progress Notes (Signed)
Subjective:    Patient ID: Austin EvansBrandon E Rosenberger, male    DOB: 01/05/1991, 23 y.o.   MRN: [id number]  HPI A Caucasian 23yo male presents to office today due to palpitations and headache.  Patient is accompanied by mother and girlfriend.  Last visit was 07/10/14 for chest pain.  Patient states he still gets the chest pain located on left side of chest around heart.  Patient later expressed in visit that the chest pain is also on right lower rib cage.  Last EKG was 07/10/14 and showed Normal Sinus Rhythm WNL.  Patient states he has been having heart palpitations and headaches every day.  He had one bad headache to the point that is caused him to stay in bed all day.  The headaches happen more in the mornings.  He has tried Excedrin Migraines OTC medication with no relief.  Patient states the headaches are located in front and occipital area and that they come and go.  He does notice the chest pain/plapitations when sitting or after walking/exercise.  He was prescribed prednisone and Mobic at last visit on 07/10/14.  Patient states the Mobic gave him GI issues.    Patient was prescribed Xanax 1mg  at night for insomnia on 07/25/14.  He states he is sleeping longer than usual, but still getting up in middle of night.  Patient does admit to going to bed at 1am and getting up at different hours of the day.  Patient states he is not working right now.  Told patient to try going to bed at same time and getting up the same time.  Told him to also put away all electronic devices and turn off all lights and take Xanax at bedtime.    Alcohol- Never  Tobacco- Never smoked Coffee- 1 cup of coffee once every other day Tea- Sweet tea and water  Last visit with Dr. Graciela HusbandsKlein on 03/22/13- Suppose to follow up in 6 months (January 2015).  Review of Systems  Constitutional: Positive for fatigue. Negative for fever, chills and diaphoresis.  Eyes: Negative.   Respiratory: Negative.   Cardiovascular: Positive for chest pain  and palpitations.  Gastrointestinal: Positive for nausea. Negative for vomiting, abdominal pain, diarrhea and constipation.       Bowel Movements- 2-3 day. Mother was worried about time spent in bathroom.  Patient does admit to texting while using restroom.   Endocrine: Negative.   Genitourinary: Negative.   Skin: Negative.  Negative for rash.  Neurological: Positive for headaches.   Past Medical History  Diagnosis Date  . Syncope -neurocardiogenic     [facility]. This included an ECG, Holter monitor, echocardiogram, cranial CT scan, and tilt table testing. His echocardiogram was remarkable for a PFO by bubble study. He also appeared to have a bicuspid aortic valve but had no significant gradient.  Holter neg for significant arrhythmias. Tilt test + for neurocardiogenic syncope  . GERD (gastroesophageal reflux disease)    Current Outpatient Prescriptions on File Prior to Visit  Medication Sig Dispense Refill  . ALPRAZolam (XANAX) 1 MG tablet Take 1 tablet (1 mg total) by mouth at bedtime as needed for sleep. Take 1/2 - 1 pill qhs 30 tablet 0  . fludrocortisone (FLORINEF) 0.1 MG tablet Take 1 tablet (0.1 mg total) by mouth 2 (two) times daily. 180 tablet 1  . Multiple Vitamin (MULTIVITAMIN) tablet Take 1 tablet by mouth daily.    . pantoprazole (PROTONIX) 40 MG tablet Take 1 tablet (  40 mg total) by mouth daily. 90 tablet 4  . SODIUM CHLORIDE PO Take 100 mg by mouth daily.     No current facility-administered medications on file prior to visit.   Allergies  Allergen Reactions  . Paxil [Paroxetine Hcl]   . Vicodin [Hydrocodone-Acetaminophen]     "shakes"     BP 112/64 mmHg  Pulse 92  Temp(Src) 98.6 F (37 C) (Temporal)  Resp 16  Ht 5\' 9"  (1.753 m)  Wt 153 lb (69.4 kg)  BMI 22.58 kg/m2  SpO2 99% Wt Readings from Last 3 Encounters:  08/29/14 153 lb (69.4 kg)  07/10/14 150 lb (68.04 kg)  06/06/14 153 lb (69.4 kg)    Objective:   Physical Exam  Constitutional: He is oriented to person, place, and time. He appears well-developed and well-nourished. He does not have a sickly appearance. No distress.  HENT:  Head: Normocephalic.  Right Ear: Tympanic membrane, external ear and ear canal normal.  Left Ear: Tympanic membrane, external ear and ear canal normal.  Nose: Nose normal. Right sinus exhibits no maxillary sinus tenderness and no frontal sinus tenderness. Left sinus exhibits no maxillary sinus tenderness and no frontal sinus tenderness.  Mouth/Throat: Uvula is midline, oropharynx is clear and moist and mucous membranes are normal. Mucous membranes are not pale and not dry. No trismus in the jaw. No uvula swelling. No oropharyngeal exudate, posterior oropharyngeal edema, posterior oropharyngeal erythema or tonsillar abscesses.  Eyes: Conjunctivae, EOM and lids are normal. Pupils are equal, round, and reactive to light. Right eye exhibits no discharge. Left eye exhibits no discharge. No scleral icterus.  Neck: Trachea normal, normal range of motion and phonation normal. Neck supple. No tracheal tenderness present. No tracheal deviation present. No thyroid mass and no thyromegaly present.  Cardiovascular: Normal rate, regular rhythm, S1 normal, S2 normal, normal heart sounds, intact distal pulses and normal pulses.  Exam reveals no gallop, no distant heart sounds and no friction rub.   No murmur heard. Pulmonary/Chest: Effort normal and breath sounds normal. No stridor. No respiratory distress. He has no decreased breath sounds. He has no wheezes. He has no rhonchi. He has no rales. He exhibits no tenderness.  Abdominal: Soft. Normal appearance and bowel sounds are normal. He exhibits no distension and no mass. There is no hepatosplenomegaly. There is no tenderness. There is no rebound and no guarding. No hernia.  Lymphadenopathy:  No tenderness or LAD.  Neurological: He is alert and oriented to person, place,  and time. No cranial nerve deficit or sensory deficit. Coordination and gait normal.  Skin: Skin is warm, dry and intact. No rash noted. He is not diaphoretic.  Psychiatric: His speech is normal and behavior is normal. Judgment and thought content normal. Cognition and memory are normal.  Patient was telling jokes during entire visit  Vitals reviewed.  Assessment & Plan:  1. Bicuspid aortic valve, Neurocardiogenic syncope  Please keep your follow up appointments with Dr. Graciela Husbands.  2. Costochrondritis -Please take Ibuprofen OTC- follow directions on bottle -Please take Prednisone as prescribed for inflammation. Explained to patient that we cannot keep on giving him prednisone and that he needs to do the Ibuprofen and heat packs when he has the chest pain.  3. Palpitations and Nonintractable headache, unspecified chronicity pattern, unspecified headache type -Take Atenolol as prescribed.- atenolol (TENORMIN) 50 MG tablet; Take 1 tablet (50 mg total) by mouth at bedtime.  Dispense: 30 tablet; Refill: 0  Spoke with Dr. Oneta Rack and he agreed to start  patient on Atenolol 50mg  at bedtime.  Looked into chart further and due to hypotension in past, will have patient start on Atenolol 50mg  (1/2 tablet at bedtime).  If patient experiences side effects, told patient to call the office.  Might consider Verapamil as next possible treatment.  Patient might have possible psych issue with obsessive thoughts about chest pain, palpitations, headaches.  Discussed medication effects and SE's.  Pt agreed to treatment plan. Please follow up in 1 month with Dr. Oneta RackMcKeown.  Khai Torbert, Lise AuerJennifer L, PA-C 2:47 PM [facility]

## 2014-08-29 NOTE — Patient Instructions (Addendum)
-  Continue all medications as prescribed. -Start taking Atenolol at bedtime as prescribed. -Take Prednisone as prescribed.   Please make sure you are drinking plenty of water to stay hydrated. Please take Tylenol or Ibuprofen OTC for pain. Please use heating pad when chest pain occurs.   Please follow up in one month with Dr. Oneta RackMcKeown    Costochondritis Costochondritis, sometimes called Tietze syndrome, is a swelling and irritation (inflammation) of the tissue (cartilage) that connects your ribs with your breastbone (sternum). It causes pain in the chest and rib area. Costochondritis usually goes away on its own over time. It can take up to 6 weeks or longer to get better, especially if you are unable to limit your activities. CAUSES  Some cases of costochondritis have no known cause. Possible causes include:  Injury (trauma).  Exercise or activity such as lifting.  Severe coughing. SIGNS AND SYMPTOMS  Pain and tenderness in the chest and rib area.  Pain that gets worse when coughing or taking deep breaths.  Pain that gets worse with specific movements. DIAGNOSIS  Your health care provider will do a physical exam and ask about your symptoms. Chest X-rays or other tests may be done to rule out other problems. TREATMENT  Costochondritis usually goes away on its own over time. Your health care provider may prescribe medicine to help relieve pain. HOME CARE INSTRUCTIONS   Avoid exhausting physical activity. Try not to strain your ribs during normal activity. This would include any activities using chest, abdominal, and side muscles, especially if heavy weights are used.  Apply ice to the affected area for the first 2 days after the pain begins.  Put ice in a plastic bag.  Place a towel between your skin and the bag.  Leave the ice on for 20 minutes, 2-3 times a day.  Only take over-the-counter or prescription medicines as directed by your health care provider. SEEK MEDICAL  CARE IF:  You have redness or swelling at the rib joints. These are signs of infection.  Your pain does not go away despite rest or medicine. SEEK IMMEDIATE MEDICAL CARE IF:   Your pain increases or you are very uncomfortable.  You have shortness of breath or difficulty breathing.  You cough up blood.  You have worse chest pains, sweating, or vomiting.  You have a fever or persistent symptoms for more than 2-3 days.  You have a fever and your symptoms suddenly get worse. MAKE SURE YOU:   Understand these instructions.  Will watch your condition.  Will get help right away if you are not doing well or get worse. Document Released: 06/09/2005 Document Revised: 06/20/2013 Document Reviewed: 04/03/2013 [facility] Patient Information 2015 [employer] [place]. This information is not intended to replace advice given to you by your health care provider. Make sure you discuss any questions you have with your health care provider.

## 2014-09-09 ENCOUNTER — Telehealth: Payer: Self-pay | Admitting: Internal Medicine

## 2014-09-09 NOTE — Telephone Encounter (Signed)
[facility] and will have those records sent here for review by Dr Rhea BeltonPyrtle. He has been scheduled to see Dr Rhea BeltonPyrtle on 10/30/14.

## 2014-09-23 ENCOUNTER — Other Ambulatory Visit: Payer: Self-pay | Admitting: Internal Medicine

## 2014-09-25 ENCOUNTER — Ambulatory Visit: Payer: Self-pay | Admitting: Physician Assistant

## 2014-09-25 ENCOUNTER — Other Ambulatory Visit: Payer: Self-pay | Admitting: Internal Medicine

## 2014-09-30 ENCOUNTER — Ambulatory Visit (INDEPENDENT_AMBULATORY_CARE_PROVIDER_SITE_OTHER): Payer: 59 | Admitting: Internal Medicine

## 2014-09-30 ENCOUNTER — Encounter: Payer: Self-pay | Admitting: Internal Medicine

## 2014-09-30 VITALS — BP 126/76 | HR 80 | Temp 98.4°F | Resp 16 | Ht 68.0 in | Wt 153.6 lb

## 2014-09-30 DIAGNOSIS — R002 Palpitations: Secondary | ICD-10-CM

## 2014-09-30 MED ORDER — VERAPAMIL HCL ER 120 MG PO TBCR: EXTENDED_RELEASE_TABLET | ORAL | Status: DC

## 2014-09-30 NOTE — Patient Instructions (Signed)

## 2014-09-30 NOTE — Progress Notes (Signed)
Subjective:    Patient ID: Austin Vargas, male    DOB: 04/22/1991, 24 y.o.   MRN: [id number]  HPI patient is a 24 yo SWF accompanied today by his girlfriend. Tody's visit is for f/u of Chest pain attributed to costochondritis. Virtually all question are "I don't know - I'll have to ask my mom" and frequently he redirects the questions to his GF with a smirk. He seems to have no recall of any CP or of being able to describe it. After much prompting, he seems to admit that he has palpitations - usu at night with recumbency . He has had extensive w/u in the past and carries a dx/o Syncope and a hx/o a bicuspid AoV. He reports stopping Atenololin the past when Rx'd emperically, but can't recall if or whether he had side effects. He denies any exertional CPor associated with activity and denies any recent postural dizziness. Medication Sig  . ALPRAZolam (XANAX) 1 MG tablet TAKE 1/2 TO 1 TABLET BY MOUTH AT BEDTIME AS NEEDED  . diclofenac (VOLTAREN) 75 MG EC tablet   . fludrocortisone (FLORINEF) 0.1 MG tablet Take 1 tablet (0.1 mg total) by mouth 2 (two) times daily.  . Multiple Vitamin (MULTIVITAMIN) tablet Take 1 tablet by mouth daily.  . pantoprazole (PROTONIX) 40 MG tablet Take 1 tablet (40 mg total) by mouth daily.  . SODIUM CHLORIDE PO Take 100 mg by mouth daily.  Marland Kitchen. atenolol (TENORMIN) 50 MG tablet Take 1 tablet (50 mg total) by mouth at bedtime.  . meloxicam (MOBIC) 15 MG tablet Take 15 mg by mouth daily as needed.   Allergies  Allergen Reactions  . Paxil [Paroxetine Hcl]   . Vicodin [Hydrocodone-Acetaminophen]     "shakes"   Past Medical History  Diagnosis Date  . Syncope -neurocardiogenic     [facility]. This included an ECG, Holter monitor, echocardiogram, cranial CT scan, and tilt table testing. His echocardiogram was remarkable for a PFO by bubble study. He also appeared to have a bicuspid aortic valve but had no significant  gradient.  Holter neg for significant arrhythmias. Tilt test + for neurocardiogenic syncope  . GERD (gastroesophageal reflux disease)    Past Surgical History  Procedure Laterality Date  . Appendectomy    . Eye surgery Bilateral    Review of Systems  In addition to the HPI above,  No Fever-chills,  No Headache, No changes with Vision or hearing,  No problems swallowing food or Liquids,  No  productive Cough or Shortness of Breath,  No Abdominal pain, No Nausea or Vomitting, Bowel movements are regular,  No new skin rashes or bruises,  No new joints pains-aches,  No new weakness, tingling, numbness in any extremity,  No polyuria, polydypsia or polyphagia,  No significant Mental Stressors.  A full 10 point Review of Systems was done, except as stated above, all other Review of Systems were negative    Objective:   Physical Exam   BP 126/76 mmHg  Pulse 80  Temp(Src) 98.4 F (36.9 C)  Resp 16  Ht 5\' 8"  (1.727 m)  Wt 153 lb 9.6 oz (69.673 kg)  BMI 23.36 kg/m2  HEENT - Eac's patent. TM's Nl. EOM's full. PERRLA. NasoOroPharynx clear. Neck - supple. Nl Thyroid. Carotids 2+ & No bruits, nodes, JVD Chest - Clear equal BS w/o Rales, rhonchi, wheezes. Cor - Nl HS. RRR w/o sig MGR. PP 1(+). No edema. Abd - No palpable organomegaly, masses or  tenderness. BS nl. MS- FROM w/o deformities. Muscle power, tone and bulk Nl. Gait Nl. Neuro - No obvious Cr N abnormalities. Sensory, motor and Cerebellar functions appear Nl w/o focal abnormalities. Psyche - Affect is somewhat indifferent and inconsistent with his age.    Assessment & Plan:   1. Palpitations  - verapamil (CALAN-SR) 120 MG CR tablet; Take 1 to 2 tablets after supper as directed for palpitations  Dispense: 60 tablet; Refill: 3  - ROV 1 mo or call if questions.

## 2014-10-03 ENCOUNTER — Encounter: Payer: Self-pay | Admitting: Internal Medicine

## 2014-10-03 ENCOUNTER — Ambulatory Visit (INDEPENDENT_AMBULATORY_CARE_PROVIDER_SITE_OTHER): Payer: 59 | Admitting: Internal Medicine

## 2014-10-03 VITALS — BP 127/78 | HR 79 | Ht 70.0 in | Wt 151.2 lb

## 2014-10-03 DIAGNOSIS — R55 Syncope and collapse: Secondary | ICD-10-CM

## 2014-10-03 NOTE — Progress Notes (Signed)
Patient Care Team: Lucky CowboyWilliam McKeown, MD as PCP - General (Internal Medicine) Peter M SwazilandJordan, MD as Consulting Physician (Cardiology) Duke SalviaSteven C Violet Cart, MD as Consulting Physician (Cardiology) Corey HaroldKenneth W. Wallmeyer, MD as Referring Physician (Cardiology)   HPI  Austin Vargas is a 24 y.o. male Seen in followup for neurocardiogenic syncope being treated with salt supplementation fluid repletion  He has complaints of chest pain. These are 3 or 4 weeks making. They are described as underneath his left breast. Occasionally they're noted in his right shoulder. They are unrelated to exertion. The last 3-6 hours at a time. There are no aggravating factors; perhaps breathing makes it feel better.  He is also has some dizziness. This is relatively persistent. It doesn't make with lying or sitting. His volume is quite replete and is taking salt tablets one a day. He is now working in the in the seated at [employer]   Past Medical History  Diagnosis Date  . Syncope -neurocardiogenic     He underwent [facility]. This included an ECG, Holter monitor, echocardiogram, cranial CT scan, and tilt table testing. His echocardiogram was remarkable for a PFO by bubble study. He also appeared to have a bicuspid aortic valve but had no significant gradient.  Holter neg for significant arrhythmias. Tilt test + for neurocardiogenic syncope  . GERD (gastroesophageal reflux disease)     Past Surgical History  Procedure Laterality Date  . Appendectomy    . Eye surgery Bilateral     Current Outpatient Prescriptions  Medication Sig Dispense Refill  . ALPRAZolam (XANAX) 1 MG tablet TAKE 1/2 TO 1 TABLET BY MOUTH AT BEDTIME AS NEEDED 30 tablet 0  . diclofenac (VOLTAREN) 75 MG EC tablet   0  . fludrocortisone (FLORINEF) 0.1 MG tablet Take 1 tablet (0.1 mg total) by mouth 2 (two) times daily. 180 tablet 1  . Multiple Vitamin (MULTIVITAMIN) tablet Take 1 tablet by mouth  daily.    . pantoprazole (PROTONIX) 40 MG tablet Take 1 tablet (40 mg total) by mouth daily. 90 tablet 4  . SODIUM CHLORIDE PO Take 100 mg by mouth daily.    . verapamil (CALAN-SR) 120 MG CR tablet Take 1 to 2 tablets after supper as directed for palpitations 60 tablet 3   No current facility-administered medications for this visit.    Allergies  Allergen Reactions  . Paxil [Paroxetine Hcl]   . Vicodin [Hydrocodone-Acetaminophen]     "shakes"    Review of Systems negative except from HPI and PMH  Physical Exam BP 127/78 mmHg  Pulse 79  Ht 5\' 10"  (1.778 m)  Wt 151 lb 3.2 oz (68.584 kg)  BMI 21.69 kg/m2 Well developed and nourished in no acute distress HENT normal Neck supple with JVP-flat Clear Regular rate and rhythm, no murmurs or gallops Abd-soft with active BS No Clubbing cyanosis edema Skin-warm and dry A & Oriented  Grossly normal sensory and motor function  ECG demonstrates sinus rhythm at 53 Intervals 15/11/41 Axis LXXXVI Assessment and  Plan   Neurocardiogenic syncope He continues with some symptoms of dizziness. He is doing a good job with volume repletion. We'll increase his sodium repletion.  We have talked about the importance of avoidance of Environmental heat as much as he can  His mom has questions about the status of his heart and his PFO. There is concern apparently guarding on his fiance and about cardiac health risk of triansmisability.

## 2014-10-03 NOTE — Patient Instructions (Signed)
Your physician recommends that you continue on your current medications as directed. Please refer to the Current Medication list given to you today.  Your physician wants you to follow-up in: 1 year with Dr. Klein.  You will receive a reminder letter in the mail two months in advance. If you don't receive a letter, please call our office to schedule the follow-up appointment.  

## 2014-10-22 ENCOUNTER — Other Ambulatory Visit: Payer: Self-pay | Admitting: Internal Medicine

## 2014-10-22 ENCOUNTER — Encounter: Payer: Self-pay | Admitting: *Deleted

## 2014-10-30 ENCOUNTER — Encounter: Payer: Self-pay | Admitting: Internal Medicine

## 2014-10-30 ENCOUNTER — Ambulatory Visit (INDEPENDENT_AMBULATORY_CARE_PROVIDER_SITE_OTHER): Payer: 59 | Admitting: Internal Medicine

## 2014-10-30 ENCOUNTER — Other Ambulatory Visit (INDEPENDENT_AMBULATORY_CARE_PROVIDER_SITE_OTHER): Payer: 59

## 2014-10-30 VITALS — BP 110/60 | HR 98 | Resp 14 | Ht 69.0 in | Wt 151.4 lb

## 2014-10-30 DIAGNOSIS — K219 Gastro-esophageal reflux disease without esophagitis: Secondary | ICD-10-CM

## 2014-10-30 DIAGNOSIS — R1012 Left upper quadrant pain: Secondary | ICD-10-CM

## 2014-10-30 DIAGNOSIS — R0789 Other chest pain: Secondary | ICD-10-CM

## 2014-10-30 DIAGNOSIS — K589 Irritable bowel syndrome without diarrhea: Secondary | ICD-10-CM

## 2014-10-30 DIAGNOSIS — R195 Other fecal abnormalities: Secondary | ICD-10-CM

## 2014-10-30 DIAGNOSIS — F419 Anxiety disorder, unspecified: Secondary | ICD-10-CM

## 2014-10-30 LAB — HEPATIC FUNCTION PANEL
ALT: 11 U/L (ref 0–53)
AST: 15 U/L (ref 0–37)
Albumin: 4.5 g/dL (ref 3.5–5.2)
Alkaline Phosphatase: 67 U/L (ref 39–117)
BILIRUBIN DIRECT: 0.3 mg/dL (ref 0.0–0.3)
BILIRUBIN TOTAL: 1.4 mg/dL — AB (ref 0.2–1.2)
Total Protein: 6.8 g/dL (ref 6.0–8.3)

## 2014-10-30 LAB — HIGH SENSITIVITY CRP: CRP HIGH SENSITIVITY: 0.09 mg/L (ref 0.000–5.000)

## 2014-10-30 NOTE — Patient Instructions (Addendum)
You have been scheduled for an endoscopy. Please follow written instructions given to you at your visit today. If you use inhalers (even only as needed), please bring them with you on the day of your procedure. Your physician has requested that you go to www.startemmi.com and enter the access code given to you at your visit today. This web site gives a general overview about your procedure. However, you should still follow specific instructions given to you by our office regarding your preparation for the procedure.  Your physician has requested that you go to the basement for the following lab work before leaving today: Total immunoglobulin, Celiac 10, Hepatic fx, CRP, TtG Igg  You have been scheduled for an abdominal ultrasound at [facility] (1st floor of hospital) on Friday, 11/01/14 at 3:00 pm. Please arrive 15 minutes prior to your appointment for registration. Make certain not to have anything to eat or drink 6 hours prior to your appointment. Should you need to reschedule your appointment, please contact radiology at [phone]. This test typically takes about 30 minutes to perform.  CC:Dr Lucky CowboyWilliam McKeown

## 2014-10-30 NOTE — Progress Notes (Signed)
Subjective:    Patient ID: Austin Vargas, male    DOB: 06/24/1991, 24 y.o.   MRN: [id number]  HPI Austin Vargas is a 24 year old male with a past medical history of neurocardiogenic syncope, anxiety, GERD, probable lactose intolerance and IBS who is seen in follow-up. He is here today with his mother. He was initially seen in December 2014 to evaluate the possibility of heartburn. At that time dietary modifications were recommended and he was scheduled follow-up which never occurred.  He has continued had issues with left-sided chest pain in his been seen on the ER on 2 occasions. Reportedly GI cocktail may have helped this pain. He reports his biggest issue is left-sided chest pain which is located just under his left nipple and can on occasion radiate to the left midaxillary line. This is worse with deep inspiration and if the chest is palpated. Occasionally worse with eating. He denies heartburn, water brash, odynophagia or dysphagia. Appetite has been good. Occasionally some foods seem to make this chest pain worse. He has been taken pantoprazole 40 mg daily for approximate 6 months prescribed by primary care. He does continue to have palpitations which make him very nervous and he takes verapamil for palpitations. He also uses Ativan at night to sleep. He will on occasion have loose stool which occurs postprandially. Nonbloody and non-melenic stool. Lactose does seem to make loose stools worse.  Review of Systems As per history of present illness, otherwise negative  Current Medications, Allergies, Past Medical History, Past Surgical History, Family History and Social History were reviewed in [employer] record.      Objective:   Physical Exam BP 110/60 mmHg  Pulse 98  Resp 14  Ht  (1.753 m)  Wt 151 lb 6.4 oz (68.675 kg)  BMI 22.35 kg/m2 Constitutional: Well-developed and well-nourished. No distress. HEENT: Normocephalic and atraumatic. Oropharynx  is clear and moist. No oropharyngeal exudate. Conjunctivae are normal.  No scleral icterus. Neck: Neck supple. Trachea midline. Cardiovascular: Normal rate, regular rhythm and intact distal pulses. No M/R/G, pain over the rib cage with palpation in the left anterior chest both above and below the left nipple Pulmonary/chest: Effort normal and breath sounds normal. No wheezing, rales or rhonchi. Abdominal: Soft, nontender, nondistended. Bowel sounds active throughout. There are no masses palpable. No hepatosplenomegaly. Extremities: no clubbing, cyanosis, or edema Lymphadenopathy: No cervical adenopathy noted. Neurological: Alert and oriented to person place and time. Skin: Skin is warm and dry. No rashes noted. Psychiatric: Normal mood and affect. Behavior is normal.  CBC    Component Value Date/Time   WBC 8.4 06/06/2014 1526   RBC 4.86 06/06/2014 1526   HGB 15.1 06/06/2014 1526   HCT 43.5 06/06/2014 1526   PLT 290 06/06/2014 1526   MCV 89.5 06/06/2014 1526   MCH 31.1 06/06/2014 1526   MCHC 34.7 06/06/2014 1526   RDW 13.3 06/06/2014 1526   LYMPHSABS 2.4 06/06/2014 1526   MONOABS 0.7 06/06/2014 1526   EOSABS 0.1 06/06/2014 1526   BASOSABS 0.1 06/06/2014 1526    CMP     Component Value Date/Time   NA 142 06/06/2014 1526   K 3.8 06/06/2014 1526   CL 106 06/06/2014 1526   CO2 27 06/06/2014 1526   GLUCOSE 100* 06/06/2014 1526   BUN 10 06/06/2014 1526   CREATININE 0.89 06/06/2014 1526   CREATININE 0.9 08/22/2013 0953   CALCIUM 9.7 06/06/2014 1526   PROT 7.1 06/06/2014 1526   ALBUMIN 4.5 06/06/2014  1526   AST 17 06/06/2014 1526   ALT 14 06/06/2014 1526   ALKPHOS 61 06/06/2014 1526   BILITOT 1.2 06/06/2014 1526    ER records reviewed -- he received GI cocktail and Toradol which reportedly helped with left-sided chest pain     Assessment & Plan:   24 year old male with a past medical history of neurocardiogenic syncope, anxiety, GERD, probable lactose intolerance and IBS  who is seen in follow-up.   1. Left-sided chest pain/GERD -- I do not feel his left-sided chest pain is related to esophageal pain nor uncontrolled reflux disease. He is taking pantoprazole 40 mg daily and not having typical esophageal or epigastric complaint. I do think anxiety is playing a large part in an element of his symptoms. We discussed workup and he is in need of reassurance. To this regard I will proceed with upper endoscopy. We discussed the risks and benefits, and alternatives of upper endoscopy and he is agreeable to proceed. Abdominal ultrasound to rule out splenomegaly and gallstones.  2. Urgent loose stools -- likely component of IBS. Celiac panel was negative previously but IgA was low. I will recheck a TTG IgG antibody, total immunoglobulins, CRP today.  3. Anxiety -- likely a component to current symptomatology. I've encouraged him to discuss treatment with primary care  Further recommendations after imaging and endoscopy along with additional labs.

## 2014-10-31 LAB — CELIAC PANEL 10
Endomysial Screen: NEGATIVE
GLIADIN IGA: 2 U (ref <20)
GLIADIN IGG: 2 U (ref <20)
IgA: 37 mg/dL — ABNORMAL LOW (ref 68–379)
Tissue Transglut Ab: 1 U/mL (ref <6)
Tissue Transglutaminase Ab, IgA: 1 U/mL (ref <4)

## 2014-11-01 ENCOUNTER — Ambulatory Visit (HOSPITAL_COMMUNITY)
Admission: RE | Admit: 2014-11-01 | Discharge: 2014-11-01 | Disposition: A | Discharge status: Home / Self Care | Payer: 59 | Source: Ambulatory Visit | Attending: Internal Medicine | Admitting: Internal Medicine

## 2014-11-01 DIAGNOSIS — R109 Unspecified abdominal pain: Secondary | ICD-10-CM | POA: Insufficient documentation

## 2014-11-01 DIAGNOSIS — R1012 Left upper quadrant pain: Secondary | ICD-10-CM

## 2014-11-01 LAB — IMMUNOGLOBULINS A/E/G/M, SERUM
IGA/IMMUNOGLOBULIN A, SERUM: 32 mg/dL — AB (ref 91–414)
IGG (IMMUNOGLOBIN G), SERUM: 875 mg/dL (ref 700–1600)
IGM (IMMUNOGLOBULIN M), SRM: 58 mg/dL (ref 40–230)
IgE (Immunoglobulin E), Serum: 66 IU/mL (ref 0–100)

## 2014-11-04 ENCOUNTER — Ambulatory Visit: Payer: Self-pay | Admitting: Internal Medicine

## 2014-11-06 ENCOUNTER — Ambulatory Visit (AMBULATORY_SURGERY_CENTER): Payer: 59 | Admitting: Internal Medicine

## 2014-11-06 ENCOUNTER — Encounter: Payer: Self-pay | Admitting: Internal Medicine

## 2014-11-06 VITALS — BP 114/76 | HR 71 | Temp 96.7°F | Resp 18 | Ht 69.0 in | Wt 151.0 lb

## 2014-11-06 DIAGNOSIS — R0789 Other chest pain: Secondary | ICD-10-CM

## 2014-11-06 DIAGNOSIS — K219 Gastro-esophageal reflux disease without esophagitis: Secondary | ICD-10-CM

## 2014-11-06 MED ORDER — SODIUM CHLORIDE 0.9 % IV SOLN: 500.0000 mL | INTRAVENOUS | Status: DC

## 2014-11-06 NOTE — Progress Notes (Signed)
A/ox3 pleased with MAC, report to Wendy RN 

## 2014-11-06 NOTE — Patient Instructions (Signed)
YOU HAD AN ENDOSCOPIC PROCEDURE TODAY AT THE [facility]: Refer to the procedure report that was given to you for any specific questions about what was found during the examination.  If the procedure report does not answer your questions, please call your gastroenterologist to clarify.  If you requested that your care partner not be given the details of your procedure findings, then the procedure report has been included in a sealed envelope for you to review at your convenience later.  YOU SHOULD EXPECT: Some feelings of bloating in the abdomen. Passage of more gas than usual.  Walking can help get rid of the air that was put into your GI tract during the procedure and reduce the bloating. If you had a lower endoscopy (such as a colonoscopy or flexible sigmoidoscopy) you may notice spotting of blood in your stool or on the toilet paper. If you underwent a bowel prep for your procedure, then you may not have a normal bowel movement for a few days.  DIET: Your first meal following the procedure should be a light meal and then it is ok to progress to your normal diet.  A half-sandwich or bowl of soup is an example of a good first meal.  Heavy or fried foods are harder to digest and may make you feel nauseous or bloated.  Likewise meals heavy in dairy and vegetables can cause extra gas to form and this can also increase the bloating.  Drink plenty of fluids but you should avoid alcoholic beverages for 24 hours.  ACTIVITY: Your care partner should take you home directly after the procedure.  You should plan to take it easy, moving slowly for the rest of the day.  You can resume normal activity the day after the procedure however you should NOT DRIVE or use heavy machinery for 24 hours (because of the sedation medicines used during the test).    SYMPTOMS TO REPORT IMMEDIATELY: A gastroenterologist can be reached at any hour.  During normal business hours, 8:30 AM to 5:00 PM Monday through Friday,  call [phone].  After hours and on weekends, please call the GI answering service at [phone] who will take a message and have the physician on call contact you.   Following upper endoscopy (EGD)  Vomiting of blood or coffee ground material  New chest pain or pain under the shoulder blades  Painful or persistently difficult swallowing  New shortness of breath  Fever of 100F or higher  Black, tarry-looking stools  FOLLOW UP: If any biopsies were taken you will be contacted by phone or by letter within the next 1-3 weeks.  Call your gastroenterologist if you have not heard about the biopsies in 3 weeks.  Our staff will call the home number listed on your records the next business day following your procedure to check on you and address any questions or concerns that you may have at that time regarding the information given to you following your procedure. This is a courtesy call and so if there is no answer at the home number and we have not heard from you through the emergency physician on call, we will assume that you have returned to your regular daily activities without incident.  SIGNATURES/CONFIDENTIALITY: You and/or your care partner have signed paperwork which will be entered into your electronic medical record.  These signatures attest to the fact that that the information above on your After Visit Summary has been reviewed and is understood.  Full responsibility   of the confidentiality of this discharge information lies with you and/or your care-partner.  Recommendations Discharge instructions given to patient and/or care partner.  

## 2014-11-06 NOTE — Op Note (Signed)
[facility] [address] [place], [id number]   ENDOSCOPY PROCEDURE REPORT  PATIENT: Austin Vargas, Austin Vargas  MR#: [id number] BIRTHDATE: 30-Oct-1990 , 23  yrs. old GENDER: male ENDOSCOPIST: Beverley FiedlerJay M Elyssia Strausser, MD REFERRED BY:  Lucky CowboyWilliam McKeown, M.D. PROCEDURE DATE:  11/06/2014 PROCEDURE:  EGD, diagnostic ASA CLASS:     Class II INDICATIONS:  history of GERD and left-sided chest pain. MEDICATIONS: Monitored anesthesia care and Propofol 200 mg IV TOPICAL ANESTHETIC: none  DESCRIPTION OF PROCEDURE: After the risks benefits and alternatives of the procedure were thoroughly explained, informed consent was obtained.  The LB [id number] endoscope was introduced through the mouth and advanced to the second portion of the duodenum , Without limitations.  The instrument was slowly withdrawn as the mucosa was fully examined.   EXAM: The esophagus and gastroesophageal junction were completely normal in appearance.  The stomach was entered and closely examined.The antrum, angularis, and lesser curvature were well visualized, including a retroflexed view of the cardia and fundus. The stomach wall was normally distensible.  The scope passed easily through the pylorus into the duodenum.  Retroflexed views revealed no abnormalities.     The scope was then withdrawn from the patient and the procedure completed.  COMPLICATIONS: There were no immediate complications.  ENDOSCOPIC IMPRESSION: Normal upper endoscopy  RECOMMENDATIONS: 1.  Chest pain previously discussed not likely GI related 2.  Continue current medications 3.  Follow-up with primary care and GI as needed  eSigned:  Beverley FiedlerJay M Felicia Both, MD 11/06/2014 10:48 AM    FA:OZHYQMVCC:William Oneta RackMcKeown, MD and The Patient

## 2014-11-07 ENCOUNTER — Telehealth: Payer: Self-pay | Admitting: *Deleted

## 2014-11-07 NOTE — Telephone Encounter (Signed)
Left message on f/u callback 

## 2014-11-27 ENCOUNTER — Encounter: Payer: Self-pay | Admitting: Internal Medicine

## 2014-11-27 ENCOUNTER — Ambulatory Visit (INDEPENDENT_AMBULATORY_CARE_PROVIDER_SITE_OTHER): Payer: 59 | Admitting: Internal Medicine

## 2014-11-27 VITALS — BP 114/76 | HR 80 | Temp 97.5°F | Resp 16 | Ht 69.0 in | Wt 149.8 lb

## 2014-11-27 DIAGNOSIS — R002 Palpitations: Secondary | ICD-10-CM | POA: Insufficient documentation

## 2014-11-27 DIAGNOSIS — G43909 Migraine, unspecified, not intractable, without status migrainosus: Secondary | ICD-10-CM | POA: Insufficient documentation

## 2014-11-27 DIAGNOSIS — R12 Heartburn: Secondary | ICD-10-CM

## 2014-11-27 DIAGNOSIS — G43009 Migraine without aura, not intractable, without status migrainosus: Secondary | ICD-10-CM

## 2014-11-27 MED ORDER — RANITIDINE HCL 300 MG PO TABS: ORAL_TABLET | ORAL | Status: DC

## 2014-11-27 NOTE — Progress Notes (Signed)
   Subjective:    Patient ID: Austin EvansBrandon E Disanti, male    DOB: 03/01/1991, 24 y.o.   MRN: [id number]  HPI  Patient returns for f/u of treatment with Verapamil and reports he feels much improved with esentially no palpitations and is happy that he's also been Migraine free since on the Verapamil. But he has experienced more epigastric discomfort of a burning quality rated at 7/10 (?) despite being on pantoprazole. He denies any noted aggravating or alleviating factors. Denies reflux or water brash sx's.  Medication Sig  . ALPRAZolam (XANAX) 1 MG tablet TAKE 1/2 TO 1 TABLET BY MOUTH AT BEDTIME AS NEEDED  . fludrocortisone (FLORINEF) 0.1 MG t Take 1 tablet (0.1 mg total) by mouth 2 (two) times daily.  . SODIUM CHLORIDE PO Take 100 mg by mouth daily.  . verapamil (CALAN-SR) 120 MG CR  Take 1 to 2 tablets after supper as directed for palpitations  . Multiple Vitamin (MULTIVITAMIN) tablet Take 1 tablet by mouth daily.  . pantoprazole (PROTONIX) 40 MG tablet Take 1 tablet (40 mg total) by mouth daily.   Allergies  Allergen Reactions  . Paxil [Paroxetine Hcl]     hallucinations  . Vicodin [Hydrocodone-Acetaminophen]     "shakes"   Past Medical History  Diagnosis Date  . Syncope -neurocardiogenic     [facility]. This included an ECG, Holter monitor, echocardiogram, cranial CT scan, and tilt table testing. His echocardiogram was remarkable for a PFO by bubble study. He also appeared to have a bicuspid aortic valve but had no significant gradient.  Holter neg for significant arrhythmias. Tilt test + for neurocardiogenic syncope  . GERD (gastroesophageal reflux disease)   . GERD (gastroesophageal reflux disease)   . Patent foramen ovale   . Bicuspid aortic valve   . Allergy   . Anxiety   . Heart murmur    Past Surgical History  Procedure Laterality Date  . Appendectomy    . Eye surgery Bilateral    Review of Systems 10 point systems  review negative except as above.    Objective:   Physical Exam   BP 114/76 mmHg  Pulse 80  Temp(Src) 97.5 F (36.4 C)  Resp 16  Ht 5\' 9"  (1.753 m)  Wt 149 lb 12.8 oz (67.949 kg)  BMI 22.11 kg/m2  HEENT - Eac's patent. TM's Nl. EOM's full. PERRLA. NasoOroPharynx clear. Neck - supple. Nl Thyroid. Carotids 2+ & No bruits, nodes, JVD Chest - Clear equal BS w/o Rales, rhonchi, wheezes. Cor - Nl HS. RRR w/o sig MGR. PP 1(+). No edema. Abd - No palpable organomegaly, masses or tenderness. Benign.  BS nl. MS- FROM w/o deformities. Muscle power, tone and bulk Nl. Gait Nl. Neuro - Nl. Psyche - Mental status normal & appropriate.     Assessment & Plan:   1. Heartburn  - discussed recent concern re: potrential kidney damage on PPI's an to switch to ranitidine.  - ranitidine (ZANTAC) 300 MG tablet; Take 1 to 2 tablets daily as needed for indigestion / heartburn  Dispense: 90 tablet; Refill: 2 - ROV 6 weeks to allow 2-4 weeks off Pantoprazole and if still GI msx's , consider H.Pylori stool sx's.  2. Palpitations  - improved  3. Migraine   - improved

## 2014-11-27 NOTE — Patient Instructions (Addendum)
Nexium/protonix/prilosec/ PROTONIX  are called PPI's, they are great at healing your stomach but should only be taken for a short period of time.   Studies are showing that taken for a long time it can increase the risk of osteoporosis (weakening of your bones), pneumonia, low magnesium, restless legs, Cdiff (infection that causes diarrhea), and most recently kidney disease/insufficiency.  Due to this information we want to try to stop the PPI but if you try to stop it abruptly this can cause rebound acid and worsening symptoms.   So this is how we want you to get off the PPI: - Start taking the nexium/protonix/prilosec/ PROTONIX  or which every PPI you are on every other day for 2 week while starting to take pepcid or zantac (generic is fine) 2 x a day - then decrease the PPI to every 3 days for 2 weeks and then stop while continuing on the zantac or pepcid twice daily. - then you can try once at night for 2 weeks - you can continue on this once at night or stop all together - Avoid alcohol, spicy foods, NSAIDS (aleve, ibuprofen) at this time. See foods below.   ++++++++++++++++++++++++++++++++++++++++++++++++++++++++++++++++++++++++++++++++++++++++++++++++++++++++++++++++++++++++++++  Food Choices for Gastroesophageal Reflux Disease  When you have gastroesophageal reflux disease (GERD), the foods you eat and your eating habits are very important. Choosing the right foods can help ease the discomfort of GERD. WHAT GENERAL GUIDELINES DO I NEED TO FOLLOW?  Choose fruits, vegetables, whole grains, low-fat dairy products, and low-fat meat, fish, and poultry.  Limit fats such as oils, salad dressings, butter, nuts, and avocado.  Keep a food diary to identify foods that cause symptoms.  Avoid foods that cause reflux. These may be different for different people.  Eat frequent small meals instead of three large meals each day.  Eat your meals slowly, in a relaxed setting.  Limit fried  foods.  Cook foods using methods other than frying.  Avoid drinking alcohol.  Avoid drinking large amounts of liquids with your meals.  Avoid bending over or lying down until 2-3 hours after eating. WHAT FOODS ARE NOT RECOMMENDED? The following are some foods and drinks that may worsen your symptoms: Vegetables Tomatoes. Tomato juice. Tomato and spaghetti sauce. Chili peppers. Onion and garlic. Horseradish. Fruits Oranges, grapefruit, and lemon (fruit and juice). Meats High-fat meats, fish, and poultry. This includes hot dogs, ribs, ham, sausage, salami, and bacon. Dairy Whole milk and chocolate milk. Sour cream. Cream. Butter. Ice cream. Cream cheese.  Beverages Coffee and tea, with or without caffeine. Carbonated beverages or energy drinks. Condiments Hot sauce. Barbecue sauce.  Sweets/Desserts Chocolate and cocoa. Donuts. Peppermint and spearmint. Fats and Oils High-fat foods, including [place] fries and potato chips. Other Vinegar. Strong spices, such as black pepper, white pepper, red pepper, cayenne, curry powder, cloves, ginger, and chili powder. / PROTONIX

## 2015-01-01 ENCOUNTER — Encounter: Payer: Self-pay | Admitting: Internal Medicine

## 2015-01-01 ENCOUNTER — Ambulatory Visit: Payer: Commercial Managed Care - HMO | Admitting: Internal Medicine

## 2015-01-01 NOTE — Progress Notes (Signed)
Patient ID: Austin EvansBrandon E Vargas, male   DOB: 10/08/1990, 24 y.o.   MRN: [id number]  [name]  O      S  H  O  W

## 2015-01-21 ENCOUNTER — Ambulatory Visit (INDEPENDENT_AMBULATORY_CARE_PROVIDER_SITE_OTHER): Payer: 59 | Admitting: Internal Medicine

## 2015-01-21 ENCOUNTER — Encounter: Payer: Self-pay | Admitting: Internal Medicine

## 2015-01-21 VITALS — BP 108/64 | HR 76 | Temp 97.3°F | Resp 16 | Ht 69.0 in | Wt 140.0 lb

## 2015-01-21 DIAGNOSIS — K219 Gastro-esophageal reflux disease without esophagitis: Secondary | ICD-10-CM

## 2015-01-21 DIAGNOSIS — R002 Palpitations: Secondary | ICD-10-CM

## 2015-01-21 DIAGNOSIS — G43009 Migraine without aura, not intractable, without status migrainosus: Secondary | ICD-10-CM

## 2015-01-21 DIAGNOSIS — Z79899 Other long term (current) drug therapy: Secondary | ICD-10-CM

## 2015-01-21 LAB — HEPATIC FUNCTION PANEL
ALT: 8 U/L (ref 0–53)
AST: 13 U/L (ref 0–37)
Albumin: 4.2 g/dL (ref 3.5–5.2)
Alkaline Phosphatase: 82 U/L (ref 39–117)
BILIRUBIN DIRECT: 0.4 mg/dL — AB (ref 0.0–0.3)
BILIRUBIN INDIRECT: 1.1 mg/dL (ref 0.2–1.2)
BILIRUBIN TOTAL: 1.5 mg/dL — AB (ref 0.2–1.2)
Total Protein: 6.1 g/dL (ref 6.0–8.3)

## 2015-01-21 LAB — BASIC METABOLIC PANEL WITH GFR
BUN: 10 mg/dL (ref 6–23)
CO2: 26 mEq/L (ref 19–32)
CREATININE: 0.92 mg/dL (ref 0.50–1.35)
Calcium: 9.4 mg/dL (ref 8.4–10.5)
Chloride: 106 mEq/L (ref 96–112)
GFR, Est Non African American: >89 mL/min
Glucose, Bld: 87 mg/dL (ref 70–99)
Potassium: 3.9 mEq/L (ref 3.5–5.3)
Sodium: 141 mEq/L (ref 135–145)

## 2015-01-21 LAB — CBC WITH DIFFERENTIAL/PLATELET
Basophils Absolute: 0 10*3/uL (ref 0.0–0.1)
Basophils Relative: 1 % (ref 0–1)
EOS PCT: 3 % (ref 0–5)
Eosinophils Absolute: 0.1 10*3/uL (ref 0.0–0.7)
HEMATOCRIT: 42.3 % (ref 39.0–52.0)
Hemoglobin: 14.3 g/dL (ref 13.0–17.0)
LYMPHS PCT: 32 % (ref 12–46)
Lymphs Abs: 1.4 10*3/uL (ref 0.7–4.0)
MCH: 30.1 pg (ref 26.0–34.0)
MCHC: 33.8 g/dL (ref 30.0–36.0)
MCV: 89.1 fL (ref 78.0–100.0)
MONO ABS: 0.4 10*3/uL (ref 0.1–1.0)
MPV: 9 fL (ref 8.6–12.4)
Monocytes Relative: 10 % (ref 3–12)
Neutro Abs: 2.3 10*3/uL (ref 1.7–7.7)
Neutrophils Relative %: 54 % (ref 43–77)
PLATELETS: 252 10*3/uL (ref 150–400)
RBC: 4.75 MIL/uL (ref 4.22–5.81)
RDW: 12.8 % (ref 11.5–15.5)
WBC: 4.3 10*3/uL (ref 4.0–10.5)

## 2015-01-21 LAB — TSH: TSH: 0.304 u[IU]/mL — AB (ref 0.350–4.500)

## 2015-01-21 LAB — MAGNESIUM: MAGNESIUM: 1.8 mg/dL (ref 1.5–2.5)

## 2015-01-21 MED ORDER — VERAPAMIL HCL ER 120 MG PO TBCR: EXTENDED_RELEASE_TABLET | ORAL | Status: DC

## 2015-01-21 NOTE — Patient Instructions (Signed)
Recurrent Migraine Headache A migraine headache is an intense, throbbing pain on one or both sides of your head. Recurrent migraines keep coming back. A migraine can last for 30 minutes to several hours. CAUSES  The exact cause of a migraine headache is not always known. However, a migraine may be caused when nerves in the brain become irritated and release chemicals that cause inflammation. This causes pain. Certain things may also trigger migraines, such as:   Alcohol.  Smoking.  Stress.  Menstruation.  Aged cheeses.  Foods or drinks that contain nitrates, glutamate, aspartame, or tyramine.  Lack of sleep.  Chocolate.  Caffeine.  Hunger.  Physical exertion.  Fatigue.  Medicines used to treat chest pain (nitroglycerine), birth control pills, estrogen, and some blood pressure medicines. SYMPTOMS   Pain on one or both sides of your head.  Pulsating or throbbing pain.  Severe pain that prevents daily activities.  Pain that is aggravated by any physical activity.  Nausea, vomiting, or both.  Dizziness.  Pain with exposure to bright lights, loud noises, or activity.  General sensitivity to bright lights, loud noises, or smells. Before you get a migraine, you may get warning signs that a migraine is coming (aura). An aura may include:  Seeing flashing lights.  Seeing bright spots, halos, or zigzag lines.  Having tunnel vision or blurred vision.  Having feelings of numbness or tingling.  Having trouble talking.  Having muscle weakness. DIAGNOSIS  A recurrent migraine headache is often diagnosed based on:  Symptoms.  Physical examination.  A CT scan or MRI of your head. These imaging tests cannot diagnose migraines but can help rule out other causes of headaches.  TREATMENT  Medicines may be given for pain and nausea. Medicines can also be given to help prevent recurrent migraines. HOME CARE INSTRUCTIONS  Only take over-the-counter or prescription  medicines for pain or discomfort as directed by your health care provider. The use of long-term narcotics is not recommended.  Lie down in a dark, quiet room when you have a migraine.  Keep a journal to find out what may trigger your migraine headaches. For example, write down:  What you eat and drink.  How much sleep you get.  Any change to your diet or medicines.  Limit alcohol consumption.  Quit smoking if you smoke.  Get 7-9 hours of sleep, or as recommended by your health care provider.  Limit stress.  Keep lights dim if bright lights bother you and make your migraines worse. SEEK MEDICAL CARE IF:   You do not get relief from the medicines given to you.  You have a recurrence of pain.  You have a fever. SEEK IMMEDIATE MEDICAL CARE IF:  Your migraine becomes severe.  You have a stiff neck.  You have loss of vision.  You have muscular weakness or loss of muscle control.  You start losing your balance or have trouble walking.  You feel faint or pass out.  You have severe symptoms that are different from your first symptoms. MAKE SURE YOU:   Understand these instructions.  Will watch your condition.  Will get help right away if you are not doing well or get worse.   ++++++++++++++++++++++++++++++  Palpitations A palpitation is the feeling that your heartbeat is irregular. It may feel like your heart is fluttering or skipping a beat. It may also feel like your heart is beating faster than normal. This is usually not a serious problem. In some cases, you may need more  medical tests. HOME CARE  Avoid:  Caffeine in coffee, tea, soft drinks, diet pills, and energy drinks.  Chocolate.  Alcohol.  Stop smoking if you smoke.  Reduce your stress and anxiety. Try:  A method that measures bodily functions so you can learn to control them (biofeedback).  Yoga.  Meditation.  Physical activity such as swimming, jogging, or walking.  Get plenty of rest  and sleep. GET HELP IF:  Your fast or irregular heartbeat continues after 24 hours.  Your palpitations occur more often. GET HELP RIGHT AWAY IF:   You have chest pain.  You feel short of breath.  You have a very bad headache.  You feel dizzy or pass out (faint). MAKE SURE YOU:   Understand these instructions.  Will watch your condition.  Will get help right away if you are not doing well or get worse.   ++++++++++++++++++++++++++++++  GETTING OFF OF PPI's    Nexium/protonix/prilosec/Omeprazole/Dexilant/Aciphex are called PPI's, they are great at healing your stomach but should only be taken for a short period of time.     Recent studies have shown that taken for a long time they  can increase the risk of osteoporosis (weakening of your bones), pneumonia, low magnesium, restless legs, Cdiff (infection that causes diarrhea), DEMENTIA and most recently kidney damage / disease / insufficiency.     Due to this information we want to try to stop the PPI but if you try to stop it abruptly this can cause rebound acid and worsening symptoms.   So this is how we want you to get off the PPI:  - Start taking the nexium/protonix/prilosec/PPI  every other day with  zantac (ranitidine) 2 x a day for 2-4 weeks  - then decrease the PPI to every 3 days while taking the zantac (ranitidine) twice a day the other  days for 2-4  Weeks  - then you can try the zantac (ranitidine) once at night or up to 2 x day as needed.  - you can continue on this once at night or stop all together  - Avoid alcohol, spicy foods, NSAIDS (aleve, ibuprofen) at this time. See foods below.   +++++++++++++++++++++++++++++++++++++++++++  Food Choices for Gastroesophageal Reflux Disease  When you have gastroesophageal reflux disease (GERD), the foods you eat and your eating habits are very important. Choosing the right foods can help ease the discomfort of GERD. WHAT GENERAL GUIDELINES DO I NEED TO  FOLLOW?  Choose fruits, vegetables, whole grains, low-fat dairy products, and low-fat meat, fish, and poultry.  Limit fats such as oils, salad dressings, butter, nuts, and avocado.  Keep a food diary to identify foods that cause symptoms.  Avoid foods that cause reflux. These may be different for different people.  Eat frequent small meals instead of three large meals each day.  Eat your meals slowly, in a relaxed setting.  Limit fried foods.  Cook foods using methods other than frying.  Avoid drinking alcohol.  Avoid drinking large amounts of liquids with your meals.  Avoid bending over or lying down until 2-3 hours after eating.   WHAT FOODS ARE NOT RECOMMENDED? The following are some foods and drinks that may worsen your symptoms:  Vegetables Tomatoes. Tomato juice. Tomato and spaghetti sauce. Chili peppers. Onion and garlic. Horseradish. Fruits Oranges, grapefruit, and lemon (fruit and juice). Meats High-fat meats, fish, and poultry. This includes hot dogs, ribs, ham, sausage, salami, and bacon. Dairy Whole milk and chocolate milk. Sour cream. Cream. Butter. Ice cream.  Cream cheese.  Beverages Coffee and tea, with or without caffeine. Carbonated beverages or energy drinks. Condiments Hot sauce. Barbecue sauce.  Sweets/Desserts Chocolate and cocoa. Donuts. Peppermint and spearmint. Fats and Oils High-fat foods, including [place] fries and potato chips. Other Vinegar. Strong spices, such as black pepper, white pepper, red pepper, cayenne, curry powder, cloves, ginger, and chili powder. Nexium/protonix/prilosec are called PPI's, they are great at healing your stomach but should only be taken for a short period of time.

## 2015-01-26 NOTE — Progress Notes (Signed)
Subjective:    Patient ID: Austin Vargas, male    DOB: 09/19/1990, 24 y.o.   MRN: [id number]  HPI Patient returns again for f/u reporting he has transitioned off of Pantoprazole & on to Ranitidine with a better diet and sx's are controlled . Also he continues to report palpitations and migraines are likewise controlled on the verapamil.    He reports today that he and his GF are moving to [place], [place] tomorrow as she to begin Vet school in Aug and he's encoursged to arrange f/u with a LMD.   Medication Sig  . ALPRAZolam (XANAX) 1 MG tablet TAKE 1/2 TO 1 TABLET BY MOUTH AT BEDTIME AS NEEDED  . fludrocortisone (FLORINEF) 0.1 MG tablet Take 1 tablet (0.1 mg total) by mouth 2 (two) times daily.  . ranitidine (ZANTAC) 300 MG tablet Take 1 to 2 tablets daily as needed for indigestion / heartburn  . SODIUM CHLORIDE PO Take 100 mg by mouth daily.  . verapamil (CALAN-SR) 120 MG CR tablet Take 1 to 2 tablets after supper as directed for palpitations   Allergies  Allergen Reactions  . Paxil [Paroxetine Hcl]     hallucinations  . Vicodin [Hydrocodone-Acetaminophen]     "shakes"   Past Medical History  Diagnosis Date  . Syncope -neurocardiogenic     He [facility]. This included an ECG, Holter monitor, echocardiogram, cranial CT scan, and tilt table testing. His echocardiogram was remarkable for a PFO by bubble study. He also appeared to have a bicuspid aortic valve but had no significant gradient.  Holter neg for significant arrhythmias. Tilt test + for neurocardiogenic syncope  . GERD (gastroesophageal reflux disease)   . GERD (gastroesophageal reflux disease)   . Patent foramen ovale   . Bicuspid aortic valve   . Allergy   . Anxiety   . Heart murmur    Review of Systems  In addition to the HPI above,  No Fever-chills,  No changes with Vision or hearing,  No problems swallowing food or Liquids,  No Chest pain or productive Cough or  Shortness of Breath,  No Abdominal pain, No Nausea or Vomitting, Bowel movements are regular, ,  No new skin rashes,   No tingling, numbness in any extremity,  No recent weight loss,  No polyuria, dysuria,   No significant Mental Stressors.  A full 10 point Review of Systems was done, except as stated above, all other Review of Systems were negative    Objective:   Physical Exam   BP 108/64 mmHg  Pulse 76  Temp(Src) 97.3 F (36.3 C)  Resp 16  Ht 5\' 9"  (1.753 m)  Wt 140 lb (63.504 kg)  BMI 20.67 kg/m2   HEENT - Eac's patent. TM's Nl. EOM's full. PERRLA. NasoOroPharynx clear. Neck - supple. Nl Thyroid. Carotids 2+ & No bruits, nodes, JVD Chest - Clear equal BS w/o Rales, rhonchi, wheezes. Cor - Nl HS. RRR w/o sig MGR. PP 1(+). No edema. Abd - No palpable organomegaly, masses or tenderness. BS nl. MS- FROM w/o deformities. Muscle power, tone and bulk Nl. Gait Nl. Neuro - No obvious Cr N abnormalities. Sensory, motor and Cerebellar functions appear Nl w/o focal abnormalities. Psyche - Mental status normal & appropriate.  No delusions, ideations or obvious mood abnormalities.    Assessment & Plan:   1. Gastroesophageal reflux disease, esophagitis presence not specified   2. Palpitations, controlled  - TSH - verapamil (CALAN-SR) 120 MG CR tablet;  Take 1 to 2 tablets after supper as directed for palpitations  Dispense: 60 tablet; Refill: 5  3. Migraine, controlled    4. Medication management  - CBC with Differential/Platelet - BASIC METABOLIC PANEL WITH GFR - Hepatic function panel - Magnesium  - Discussed meds/SE's and f/u as recc or can see here when in [place].

## 2015-02-25 ENCOUNTER — Other Ambulatory Visit: Payer: Self-pay | Admitting: Internal Medicine

## 2015-02-25 ENCOUNTER — Other Ambulatory Visit: Payer: Commercial Managed Care - HMO

## 2015-02-25 DIAGNOSIS — E0590 Thyrotoxicosis, unspecified without thyrotoxic crisis or storm: Secondary | ICD-10-CM

## 2015-02-26 LAB — TSH: TSH: 0.741 u[IU]/mL (ref 0.350–4.500)

## 2015-03-02 ENCOUNTER — Other Ambulatory Visit: Payer: Self-pay | Admitting: Internal Medicine

## 2015-04-05 ENCOUNTER — Other Ambulatory Visit: Payer: Self-pay | Admitting: Internal Medicine

## 2015-04-30 ENCOUNTER — Other Ambulatory Visit: Payer: Self-pay

## 2015-04-30 DIAGNOSIS — R12 Heartburn: Secondary | ICD-10-CM

## 2015-04-30 MED ORDER — RANITIDINE HCL 300 MG PO TABS: ORAL_TABLET | ORAL | Status: DC

## 2015-04-30 MED ORDER — VERAPAMIL HCL ER 120 MG PO TBCR: EXTENDED_RELEASE_TABLET | ORAL | Status: DC

## 2015-05-01 ENCOUNTER — Other Ambulatory Visit: Payer: Self-pay | Admitting: Internal Medicine

## 2015-06-16 ENCOUNTER — Ambulatory Visit (INDEPENDENT_AMBULATORY_CARE_PROVIDER_SITE_OTHER): Payer: Commercial Managed Care - HMO | Admitting: Physician Assistant

## 2015-06-16 ENCOUNTER — Encounter: Payer: Self-pay | Admitting: Physician Assistant

## 2015-06-16 VITALS — BP 126/70 | HR 96 | Temp 98.1°F | Resp 16 | Ht 69.0 in | Wt 129.0 lb

## 2015-06-16 DIAGNOSIS — Q231 Congenital insufficiency of aortic valve: Secondary | ICD-10-CM | POA: Diagnosis not present

## 2015-06-16 DIAGNOSIS — R002 Palpitations: Secondary | ICD-10-CM | POA: Diagnosis not present

## 2015-06-16 LAB — CBC WITH DIFFERENTIAL/PLATELET
BASOS ABS: 0.1 10*3/uL (ref 0.0–0.1)
Basophils Relative: 1 % (ref 0–1)
EOS ABS: 0.1 10*3/uL (ref 0.0–0.7)
Eosinophils Relative: 2 % (ref 0–5)
HCT: 44.3 % (ref 39.0–52.0)
HEMOGLOBIN: 15.2 g/dL (ref 13.0–17.0)
Lymphocytes Relative: 23 % (ref 12–46)
Lymphs Abs: 1.5 10*3/uL (ref 0.7–4.0)
MCH: 30.6 pg (ref 26.0–34.0)
MCHC: 34.3 g/dL (ref 30.0–36.0)
MCV: 89.3 fL (ref 78.0–100.0)
MONOS PCT: 8 % (ref 3–12)
MPV: 8.8 fL (ref 8.6–12.4)
Monocytes Absolute: 0.5 10*3/uL (ref 0.1–1.0)
NEUTROS ABS: 4.4 10*3/uL (ref 1.7–7.7)
Neutrophils Relative %: 66 % (ref 43–77)
PLATELETS: 245 10*3/uL (ref 150–400)
RBC: 4.96 MIL/uL (ref 4.22–5.81)
RDW: 13.2 % (ref 11.5–15.5)
WBC: 6.7 10*3/uL (ref 4.0–10.5)

## 2015-06-16 NOTE — Patient Instructions (Signed)
Hyperthyroidism  The thyroid is a large gland located in the lower front part of your neck. The thyroid helps control metabolism. Metabolism is how your body uses food. It controls metabolism with the hormone thyroxine. When the thyroid is overactive, it produces too much hormone. When this happens, these following problems may occur:   · Nervousness  · Heat intolerance  · Weight loss (in spite of increase food intake)  · Diarrhea  · Change in hair or skin texture  · Palpitations (heart skipping or having extra beats)  · Tachycardia (rapid heart rate)  · Loss of menstruation (amenorrhea)  · Shaking of the hands  CAUSES  · Grave's Disease (the immune system attacks the thyroid gland). This is the most common cause.  · Inflammation of the thyroid gland.  · Tumor (usually benign) in the thyroid gland or elsewhere.  · Excessive use of thyroid medications (both prescription and 'natural').  · Excessive ingestion of Iodine.  DIAGNOSIS   To prove hyperthyroidism, your caregiver may do blood tests and ultrasound tests. Sometimes the signs are hidden. It may be necessary for your caregiver to watch this illness with blood tests, either before or after diagnosis and treatment.  TREATMENT  Short-term treatment  There are several treatments to control symptoms. Drugs called beta blockers may give some relief. Drugs that decrease hormone production will provide temporary relief in many people. These measures will usually not give permanent relief.  Definitive therapy  There are treatments available which can be discussed between you and your caregiver which will permanently treat the problem. These treatments range from surgery (removal of the thyroid), to the use of radioactive iodine (destroys the thyroid by radiation), to the use of antithyroid drugs (interfere with hormone synthesis). The first two treatments are permanent and usually successful. They most often require hormone replacement therapy for life. This is because  it is impossible to remove or destroy the exact amount of thyroid required to make a person euthyroid (normal).  HOME CARE INSTRUCTIONS   See your caregiver if the problems you are being treated for get worse. Examples of this would be the problems listed above.  SEEK MEDICAL CARE IF:  Your general condition worsens.  MAKE SURE YOU:   · Understand these instructions.  · Will watch your condition.  · Will get help right away if you are not doing well or get worse.  Document Released: 08/30/2005 Document Revised: 11/22/2011 Document Reviewed: 01/11/2007  ExitCare® Patient Information ©2015 [employer]. This information is not intended to replace advice given to you by your health care provider. Make sure you discuss any questions you have with your health care provider.

## 2015-06-16 NOTE — Progress Notes (Signed)
Subjective:    Patient ID: Austin Vargas, male    DOB: 1990-10-08, 24 y.o.   MRN: [id number]  HPI 24 y.o. WM with history of bicuspid Aortic valve without gradient, PFO, extensive negative syncopal work up, now on florinef presents with palpitations. He was put on verapamil in Jan and was doing better but he continues to have palpitations and with occ SOB for 20 mins, nonexertional, intermittent through out the day. Has lost weight, has been exercising more, no diarrhea. Will get CP frequently, left sided chest pain that will go through to his back, occ worse with breathing.  Normal EGD with Dr. Rhea Belton in Feb. Ran Friday morning, 20-30 mins, had some dizziness but no SOB, CP, nausea.   Wt Readings from Last 3 Encounters:  06/16/15 129 lb (58.514 kg)  01/21/15 140 lb (63.504 kg)  11/27/14 149 lb 12.8 oz (67.949 kg)    Blood pressure 126/70, pulse 96, temperature 98.1 F (36.7 C), temperature source Temporal, resp. rate 16, height  (1.753 m), weight 129 lb (58.514 kg), SpO2 98 %.  Past Medical History  Diagnosis Date  . Syncope -neurocardiogenic     He underwent extensive evaluation by [facility]. This included an ECG, Holter monitor, echocardiogram, cranial CT scan, and tilt table testing. His echocardiogram was remarkable for a PFO by bubble study. He also appeared to have a bicuspid aortic valve but had no significant gradient.  Holter neg for significant arrhythmias. Tilt test + for neurocardiogenic syncope  . GERD (gastroesophageal reflux disease)   . GERD (gastroesophageal reflux disease)   . Patent foramen ovale   . Bicuspid aortic valve   . Allergy   . Anxiety   . Heart murmur    Current Outpatient Prescriptions on File Prior to Visit  Medication Sig Dispense Refill  . fludrocortisone (FLORINEF) 0.1 MG tablet TAKE 1 TABLET TWICE A DAY 180 tablet 0  . ranitidine (ZANTAC) 300 MG tablet Take 1 to 2 tablets daily as needed for indigestion / heartburn  180 tablet 3  . SODIUM CHLORIDE PO Take 100 mg by mouth daily.    . verapamil (CALAN-SR) 120 MG CR tablet TAKE 1 TO 2 TABLETS BY MOUTH AFTER SUPPER AS DIRECTED FOR PALPITATIONS 180 tablet 3   No current facility-administered medications on file prior to visit.    Review of Systems  Constitutional: Positive for fatigue. Negative for fever, chills and diaphoresis.  Eyes: Negative.   Respiratory: Positive for shortness of breath. Negative for apnea, cough, choking, chest tightness, wheezing and stridor.   Cardiovascular: Positive for chest pain and palpitations.  Gastrointestinal: Negative for nausea, vomiting, abdominal pain, diarrhea and constipation.  Endocrine: Negative.   Genitourinary: Negative.   Skin: Negative.  Negative for rash.  Neurological: Positive for headaches.       Objective:   Physical Exam  Constitutional: He is oriented to person, place, and time. He appears well-developed and well-nourished.  HENT:  Head: Normocephalic and atraumatic.  Right Ear: External ear normal.  Left Ear: External ear normal.  Mouth/Throat: Oropharynx is clear and moist.  Eyes: Conjunctivae and EOM are normal. Pupils are equal, round, and reactive to light.  Neck: Normal range of motion. Neck supple.  Cardiovascular: Normal rate, regular rhythm and normal heart sounds.   Pulmonary/Chest: Effort normal and breath sounds normal.  Abdominal: Soft. Bowel sounds are normal. He exhibits no mass. There is tenderness (epigastric). There is no rebound and no guarding.  Musculoskeletal: Normal range of motion.  Neurological: He is alert and oriented to person, place, and time. No cranial nerve deficit.  Skin: Skin is warm and dry.      Assessment & Plan:  Palpitations with weight loss Recheck TSH, may need thyroid scan Check labs, valsalva taught Increase verapamil to 120 BID Follow up 2-4 weeks.

## 2015-06-17 LAB — COMPREHENSIVE METABOLIC PANEL
ALBUMIN: 4.6 g/dL (ref 3.6–5.1)
ALT: 9 U/L (ref 9–46)
AST: 13 U/L (ref 10–40)
Alkaline Phosphatase: 85 U/L (ref 40–115)
BUN: 10 mg/dL (ref 7–25)
CALCIUM: 9.6 mg/dL (ref 8.6–10.3)
CO2: 25 mmol/L (ref 20–31)
CREATININE: 0.73 mg/dL (ref 0.60–1.35)
Chloride: 105 mmol/L (ref 98–110)
Glucose, Bld: 84 mg/dL (ref 65–99)
Potassium: 3.6 mmol/L (ref 3.5–5.3)
SODIUM: 143 mmol/L (ref 135–146)
TOTAL PROTEIN: 6.6 g/dL (ref 6.1–8.1)
Total Bilirubin: 1.6 mg/dL — ABNORMAL HIGH (ref 0.2–1.2)

## 2015-06-17 LAB — TSH: TSH: 0.506 u[IU]/mL (ref 0.350–4.500)

## 2015-08-17 ENCOUNTER — Other Ambulatory Visit: Payer: Self-pay | Admitting: Physician Assistant

## 2015-08-29 ENCOUNTER — Other Ambulatory Visit: Payer: Self-pay | Admitting: Physician Assistant

## 2016-02-20 ENCOUNTER — Ambulatory Visit (INDEPENDENT_AMBULATORY_CARE_PROVIDER_SITE_OTHER): Payer: Managed Care, Other (non HMO) | Admitting: Physician Assistant

## 2016-02-20 ENCOUNTER — Encounter: Payer: Self-pay | Admitting: Physician Assistant

## 2016-02-20 VITALS — BP 116/64 | HR 93 | Temp 97.7°F | Resp 16 | Ht 69.0 in | Wt 130.8 lb

## 2016-02-20 DIAGNOSIS — K219 Gastro-esophageal reflux disease without esophagitis: Secondary | ICD-10-CM

## 2016-02-20 DIAGNOSIS — R1031 Right lower quadrant pain: Secondary | ICD-10-CM

## 2016-02-20 DIAGNOSIS — G8929 Other chronic pain: Secondary | ICD-10-CM

## 2016-02-20 LAB — C-REACTIVE PROTEIN: CRP: <0.5 mg/dL (ref <0.60)

## 2016-02-20 LAB — CBC WITH DIFFERENTIAL/PLATELET
BASOS ABS: 0 {cells}/uL (ref 0–200)
Basophils Relative: 0 %
EOS ABS: 138 {cells}/uL (ref 15–500)
Eosinophils Relative: 2 %
HCT: 43.4 % (ref 38.5–50.0)
HEMOGLOBIN: 14.8 g/dL (ref 13.2–17.1)
Lymphocytes Relative: 15 %
Lymphs Abs: 1035 cells/uL (ref 850–3900)
MCH: 30.8 pg (ref 27.0–33.0)
MCHC: 34.1 g/dL (ref 32.0–36.0)
MCV: 90.2 fL (ref 80.0–100.0)
MONOS PCT: 16 %
MPV: 8.6 fL (ref 7.5–12.5)
Monocytes Absolute: 1104 cells/uL — ABNORMAL HIGH (ref 200–950)
NEUTROS ABS: 4623 {cells}/uL (ref 1500–7800)
Neutrophils Relative %: 67 %
PLATELETS: 236 10*3/uL (ref 140–400)
RBC: 4.81 MIL/uL (ref 4.20–5.80)
RDW: 13.1 % (ref 11.0–15.0)
WBC: 6.9 10*3/uL (ref 3.8–10.8)

## 2016-02-20 LAB — COMPREHENSIVE METABOLIC PANEL
ALT: 11 U/L (ref 9–46)
AST: 16 U/L (ref 10–40)
Albumin: 4.3 g/dL (ref 3.6–5.1)
Alkaline Phosphatase: 68 U/L (ref 40–115)
BILIRUBIN TOTAL: 1.9 mg/dL — AB (ref 0.2–1.2)
BUN: 16 mg/dL (ref 7–25)
CHLORIDE: 107 mmol/L (ref 98–110)
CO2: 24 mmol/L (ref 20–31)
CREATININE: 0.75 mg/dL (ref 0.60–1.35)
Calcium: 9 mg/dL (ref 8.6–10.3)
Glucose, Bld: 73 mg/dL (ref 65–99)
Potassium: 4 mmol/L (ref 3.5–5.3)
SODIUM: 142 mmol/L (ref 135–146)
TOTAL PROTEIN: 6.5 g/dL (ref 6.1–8.1)

## 2016-02-20 LAB — AMYLASE: AMYLASE: 35 U/L (ref 0–105)

## 2016-02-20 NOTE — Patient Instructions (Addendum)
Get on zantac 300mg  at night Try the linzess start on 72 and then go to 145  Diet for Irritable Bowel Syndrome When you have irritable bowel syndrome (IBS), the foods you eat and your eating habits are very important. IBS may cause various symptoms, such as abdominal pain, constipation, or diarrhea. Choosing the right foods can help ease discomfort caused by these symptoms. Work with your health care provider and dietitian to find the best eating plan to help control your symptoms. WHAT GENERAL GUIDELINES DO I NEED TO FOLLOW?  Keep a food diary. This will help you identify foods that cause symptoms. Write down:  What you eat and when.  What symptoms you have.  When symptoms occur in relation to your meals.  Avoid foods that cause symptoms. Talk with your dietitian about other ways to get the same nutrients that are in these foods.  Eat more foods that contain fiber. Take a fiber supplement if directed by your dietitian.  Eat your meals slowly, in a relaxed setting.  Aim to eat 5-6 small meals per day. Do not skip meals.  Drink enough fluids to keep your urine clear or pale yellow.  Ask your health care provider if you should take an over-the-counter probiotic during flare-ups to help restore healthy gut bacteria.  If you have cramping or diarrhea, try making your meals low in fat and high in carbohydrates. Examples of carbohydrates are pasta, rice, whole grain breads and cereals, fruits, and vegetables.  If dairy products cause your symptoms to flare up, try eating less of them. You might be able to handle yogurt better than other dairy products because it contains bacteria that help with digestion. WHAT FOODS ARE NOT RECOMMENDED? The following are some foods and drinks that may worsen your symptoms:  Fatty foods, such as Jamaica[place] fries.  Milk products, such as cheese or ice cream.  Chocolate.  Alcohol.  Products with caffeine, such as coffee.  Carbonated drinks, such as  soda. The items listed above may not be a complete list of foods and beverages to avoid. Contact your dietitian for more information. WHAT FOODS ARE GOOD SOURCES OF FIBER? Your health care provider or dietitian may recommend that you eat more foods that contain fiber. Fiber can help reduce constipation and other IBS symptoms. Add foods with fiber to your diet a little at a time so that your body can get used to them. Too much fiber at once might cause gas and swelling of your abdomen. The following are some foods that are good sources of fiber:  Apples.  Peaches.  Pears.  Berries.  Figs.  Broccoli (raw).  Cabbage.  Carrots.  Raw peas.  Kidney beans.  Lima beans.  Whole grain bread.  Whole grain cereal. FOR MORE INFORMATION  International Foundation for Functional Gastrointestinal Disorders: www.iffgd.[employer] National Institute of Diabetes and Digestive and Kidney Diseases: http://norris-lawson.com/www.niddk.nih.gov/health-information/health-topics/digestive-diseases/ibs/Pages/facts.aspx   This information is not intended to replace advice given to you by your health care provider. Make sure you discuss any questions you have with your health care provider.   Document Released: 11/20/2003 Document Revised: 09/20/2014 Document Reviewed: 11/30/2013 Elsevier Interactive Patient Education [employer]2016 [employer].

## 2016-02-20 NOTE — Progress Notes (Signed)
Subjective:    Patient ID: Austin Vargas, male    DOB: 07-19-1991, 25 y.o.   MRN: [id number]  HPI 25 y.o. WM with history of GED, hyperthyroidism, palpitations, migraines, bicuspid AV, and syncope presents for overdue follow up and AB pain. Has seen Dr. Rhea Belton in the past and had normal EGD and gallbladder. Has AB pain periumbilical or LUQ, can be 6-10 pain scale, on and off x 6 months, lasts for hours, occ worse after eating, occ better if he curled into a ball to sleep, tums helped some. Has had about 10 lb weight loss in a year, no fever, chills.  Wt Readings from Last 3 Encounters:  02/20/16 130 lb 12.8 oz (59.33 kg)  06/16/15 129 lb (58.514 kg)  01/21/15 140 lb (63.504 kg)    Patient is in [place] now, went to [facility] on 06/08 for Ab pain x 6 months and diarrhea for 4 days but this has improved. Had FOBT negative, normal kidney function, normal LFTs, normal CBC, pending stool cultures and Hpylori. Was given Loperamide , has only taken once. Has stopped his verapamil since Monday, denies syncope.   Blood pressure 116/64, pulse 93, temperature 97.7 F (36.5 C), temperature source Temporal, resp. rate 16, height  (1.753 m), weight 130 lb 12.8 oz (59.33 kg), SpO2 99 %.  Past Medical History  Diagnosis Date  . Syncope -neurocardiogenic     He underwent extensive evaluation by [facility]. This included an ECG, Holter monitor, echocardiogram, cranial CT scan, and tilt table testing. His echocardiogram was remarkable for a PFO by bubble study. He also appeared to have a bicuspid aortic valve but had no significant gradient.  Holter neg for significant arrhythmias. Tilt test + for neurocardiogenic syncope  . GERD (gastroesophageal reflux disease)   . GERD (gastroesophageal reflux disease)   . Patent foramen ovale   . Bicuspid aortic valve   . Allergy   . Anxiety   . Heart murmur    Current Outpatient Prescriptions on File Prior to Visit  Medication Sig  Dispense Refill  . verapamil (CALAN-SR) 120 MG CR tablet TAKE 1 TO 2 TABLETS BY MOUTH AFTER SUPPER AS DIRECTED FOR PALPITATIONS 180 tablet 3   No current facility-administered medications on file prior to visit.   Review of Systems  Constitutional: Negative for fever, chills and fatigue.  HENT: Negative.   Respiratory: Negative.   Cardiovascular: Negative.   Gastrointestinal: Positive for abdominal pain and diarrhea. Negative for nausea, vomiting, constipation, blood in stool, abdominal distention, anal bleeding and rectal pain.  Genitourinary: Negative.   Musculoskeletal: Negative.   Neurological: Negative.        Objective:   Physical Exam  Constitutional: He is oriented to person, place, and time. He appears well-developed and well-nourished.  HENT:  Head: Normocephalic and atraumatic.  Right Ear: External ear normal.  Left Ear: External ear normal.  Mouth/Throat: Oropharynx is clear and moist.  Eyes: Conjunctivae and EOM are normal. Pupils are equal, round, and reactive to light.  Neck: Normal range of motion. Neck supple.  Cardiovascular: Normal rate, regular rhythm and normal heart sounds.   Pulmonary/Chest: Effort normal and breath sounds normal.  Abdominal: Soft. Bowel sounds are normal. He exhibits no mass. There is tenderness (epigastric and RLQ without rebound or peritoneal signs). There is no rebound and no guarding.  Musculoskeletal: Normal range of motion.  Neurological: He is alert and oriented to person, place, and time. No cranial nerve deficit.  Skin: Skin  is warm and dry.       Assessment & Plan:  RLQ pain/epigatric pain Add zantac, no peritoneal signs, pending stool culture/hypylori, has had normal AB US, normal EGD, will recheck CRP and CBC, CMET ? If possible IBS/constipation If worse go to ER

## 2017-03-23 DIAGNOSIS — F319 Bipolar disorder, unspecified: Secondary | ICD-10-CM | POA: Insufficient documentation

## 2017-03-23 DIAGNOSIS — F332 Major depressive disorder, recurrent severe without psychotic features: Secondary | ICD-10-CM | POA: Insufficient documentation

## 2017-03-24 ENCOUNTER — Telehealth: Payer: Self-pay | Admitting: *Deleted

## 2017-03-24 NOTE — Telephone Encounter (Signed)
Patient's mother called and states the patient was admitted for 48 hours to an in-patient mental health center in [place],. The patient has been seeing a psychiatrist in [place], but he is not pleased with his care and the family asked for a recommendation of a different doctor.  After speaking with the mother, she states the in-patient psychiatrist has been very helpful and does have office hours. She states they may see if she will accept the patient to her practice.

## 2017-09-07 DIAGNOSIS — J309 Allergic rhinitis, unspecified: Secondary | ICD-10-CM | POA: Insufficient documentation

## 2017-09-07 DIAGNOSIS — L719 Rosacea, unspecified: Secondary | ICD-10-CM | POA: Insufficient documentation

## 2019-05-17 DIAGNOSIS — R1030 Lower abdominal pain, unspecified: Secondary | ICD-10-CM | POA: Insufficient documentation

## 2019-12-06 ENCOUNTER — Ambulatory Visit: Payer: Self-pay | Attending: Internal Medicine

## 2019-12-06 DIAGNOSIS — Z23 Encounter for immunization: Secondary | ICD-10-CM

## 2019-12-06 NOTE — Progress Notes (Signed)
   Covid-19 Vaccination Clinic  Name:  Austin Vargas    MRN: [id number] DOB: 05-26-1991  12/06/2019  [name] was observed post Covid-19 immunization. At 1535 the patient with hx of neurogenic syncope; after vaccine felt "dizzy and going to faint". Heart rate was 30 and patient was assisted to they lying position on chairs. At 1540 patient vitals checked and heart rate was 74, O2 sats 100%, provided with water and crackers and states "feeling better." At 1555 Vitals checked and BP 101/77, HR 100, respiratory rate 20, sats 100% and stated "I feel much better." He was provided with Vaccine Information Sheet and instruction to access the V-Safe system.   [name] was instructed to call 911 with any severe reactions post vaccine: Marland Kitchen Difficulty breathing  . Swelling of face and throat  . A fast heartbeat  . A bad rash all over body  . Dizziness and weakness   Immunizations Administered    Name Date Dose VIS Date Route   Pfizer COVID-19 Vaccine 12/06/2019  3:22 PM 0.3 mL 08/24/2019 Intramuscular   Manufacturer: [employer], [employer]   Lot: [id number]   NDC: [address]-2

## 2019-12-31 ENCOUNTER — Ambulatory Visit: Payer: Self-pay | Attending: Internal Medicine

## 2020-06-02 ENCOUNTER — Other Ambulatory Visit: Payer: Self-pay

## 2020-06-03 ENCOUNTER — Ambulatory Visit (INDEPENDENT_AMBULATORY_CARE_PROVIDER_SITE_OTHER): Payer: PRIVATE HEALTH INSURANCE | Admitting: Family Medicine

## 2020-06-03 ENCOUNTER — Encounter: Payer: Self-pay | Admitting: Family Medicine

## 2020-06-03 VITALS — BP 110/70 | HR 87 | Temp 97.7°F | Ht 69.0 in | Wt 141.0 lb

## 2020-06-03 DIAGNOSIS — F640 Transsexualism: Secondary | ICD-10-CM

## 2020-06-03 DIAGNOSIS — Z0000 Encounter for general adult medical examination without abnormal findings: Secondary | ICD-10-CM | POA: Diagnosis not present

## 2020-06-03 DIAGNOSIS — Z789 Other specified health status: Secondary | ICD-10-CM | POA: Insufficient documentation

## 2020-06-03 NOTE — Progress Notes (Addendum)
New Patient Office Visit  Subjective:  Patient ID: Austin Vargas, male    DOB: April 23, 1991  Age: 29 y.o. MRN: [id number]  CC:  Chief Complaint  Patient presents with  . Establish Care    New patient, no concerns. Woud like referral to endo and psychologist.     HPI Austin Vargas presents for establishment of care.  Recently moved back into this area after finishing college.  Is considering law or medicine but not sure at this point.  Lives with his girlfriend and cat.  Patient is in transition.  There has been no gender surgery.  Will request referrals for endocrinology and psychology.  Drinks alcohol occasionally.  Does not smoke or use illicit drugs.  Status post both Covid vaccines.  Past Medical History:  Diagnosis Date  . Allergy   . Anxiety   . Bicuspid aortic valve   . GERD (gastroesophageal reflux disease)   . GERD (gastroesophageal reflux disease)   . Heart murmur   . Patent foramen ovale   . Syncope -neurocardiogenic    He underwent extensive evaluation by [facility]. This included an ECG, Holter monitor, echocardiogram, cranial CT scan, and tilt table testing. His echocardiogram was remarkable for a PFO by bubble study. He also appeared to have a bicuspid aortic valve but had no significant gradient.  Holter neg for significant arrhythmias. Tilt test + for neurocardiogenic syncope    Past Surgical History:  Procedure Laterality Date  . APPENDECTOMY    . EYE SURGERY Bilateral     Family History  Problem Relation Age of Onset  . Heart disease Maternal Grandmother   . Breast cancer Other        maternal great grandmother  . Diabetes Paternal Grandmother   . Heart disease Other        maternal great grandmother  . Colon cancer Neg Hx   . Throat cancer Neg Hx   . Esophageal cancer Neg Hx   . Rectal cancer Neg Hx   . Stomach cancer Neg Hx     Social History   Socioeconomic History  . Marital status: Single    Spouse name: Not on  file  . Number of children: Not on file  . Years of education: Not on file  . Highest education level: Not on file  Occupational History  . Occupation: Archivist    Comment: Clinical biochemist  Tobacco Use  . Smoking status: Never Smoker  . Smokeless tobacco: Never Used  Vaping Use  . Vaping Use: Never used  Substance and Sexual Activity  . Alcohol use: Yes    Alcohol/week: 1.0 standard drink    Types: 1 Shots of liquor per week  . Drug use: No  . Sexual activity: Yes  Other Topics Concern  . Not on file  Social History Narrative  . Not on file   Social Determinants of Health   Financial Resource Strain:   . Difficulty of Paying Living Expenses: Not on file  Food Insecurity:   . Worried About Programme researcher, broadcasting/film/video in the Last Year: Not on file  . Ran Out of Food in the Last Year: Not on file  Transportation Needs:   . Lack of Transportation (Medical): Not on file  . Lack of Transportation (Non-Medical): Not on file  Physical Activity:   . Days of Exercise per Week: Not on file  . Minutes of Exercise per Session: Not on file  Stress:   . Feeling of  Stress : Not on file  Social Connections:   . Frequency of Communication with Friends and Family: Not on file  . Frequency of Social Gatherings with Friends and Family: Not on file  . Attends Religious Services: Not on file  . Active Member of Clubs or Organizations: Not on file  . Attends Banker Meetings: Not on file  . Marital Status: Not on file  Intimate Partner Violence:   . Fear of Current or Ex-Partner: Not on file  . Emotionally Abused: Not on file  . Physically Abused: Not on file  . Sexually Abused: Not on file    ROS Review of Systems  Constitutional: Negative.   HENT: Negative.   Eyes: Negative for photophobia and visual disturbance.  Respiratory: Negative.   Cardiovascular: Negative.   Gastrointestinal: Negative.   Endocrine: Negative for polyphagia and polyuria.  Genitourinary:  Negative.   Musculoskeletal: Negative.   Allergic/Immunologic: Negative for immunocompromised state.  Neurological: Negative for light-headedness and numbness.  Hematological: Does not bruise/bleed easily.  Psychiatric/Behavioral: Negative.     Objective:   Today's Vitals: BP 110/70   Pulse 87   Temp 97.7 F (36.5 C) (Tympanic)   Ht 5\' 9"  (1.753 m)   Wt 141 lb (64 kg)   SpO2 98%   BMI 20.82 kg/m   Physical Exam Vitals and nursing note reviewed.  Constitutional:      General: He is not in acute distress.    Appearance: Normal appearance. He is normal weight. He is not ill-appearing or toxic-appearing.  HENT:     Head: Normocephalic and atraumatic.     Right Ear: Tympanic membrane, ear canal and external ear normal.     Left Ear: Tympanic membrane, ear canal and external ear normal.     Mouth/Throat:     Mouth: Mucous membranes are moist.     Pharynx: Oropharynx is clear. No oropharyngeal exudate or posterior oropharyngeal erythema.  Eyes:     General: No scleral icterus.       Right eye: No discharge.        Left eye: No discharge.     Extraocular Movements: Extraocular movements intact.     Conjunctiva/sclera: Conjunctivae normal.     Pupils: Pupils are equal, round, and reactive to light.  Cardiovascular:     Rate and Rhythm: Normal rate and regular rhythm.  Pulmonary:     Effort: Pulmonary effort is normal.     Breath sounds: Normal breath sounds.  Abdominal:     General: Bowel sounds are normal.  Genitourinary:   Musculoskeletal:     Cervical back: No rigidity or tenderness.  Lymphadenopathy:     Cervical: No cervical adenopathy.  Skin:    General: Skin is warm and dry.  Neurological:     Mental Status: He is alert and oriented to person, place, and time.  Psychiatric:        Mood and Affect: Mood normal.        Behavior: Behavior normal.     Assessment & Plan:   Problem List Items Addressed This Visit      Other   Healthcare maintenance -  Primary   Relevant Orders   CBC   Comprehensive metabolic panel   Lipid panel   HIV Antibody (routine testing w rflx)   Urinalysis, Routine w reflex microscopic   Male-to-male transgender person   Relevant Orders   Ambulatory referral to Endocrinology   Ambulatory referral to Psychology      Outpatient  Encounter Medications as of 06/03/2020  Medication Sig  . estradiol (ESTRACE) 0.5 MG tablet Take 0.5 tablets by mouth daily.  . progesterone (PROMETRIUM) 100 MG capsule Take 100 mg by mouth daily.  Marland Kitchen spironolactone (ALDACTONE) 100 MG tablet Take 100 tablets by mouth 2 (two) times daily.  . [DISCONTINUED] verapamil (CALAN-SR) 120 MG CR tablet TAKE 1 TO 2 TABLETS BY MOUTH AFTER SUPPER AS DIRECTED FOR PALPITATIONS (Patient not taking: Reported on 06/03/2020)   No facility-administered encounter medications on file as of 06/03/2020.    Follow-up: Return in about 1 year (around 06/03/2021), or if symptoms worsen or fail to improve, for return fasting for labs.Mliss Sax, MD

## 2020-06-04 ENCOUNTER — Other Ambulatory Visit: Payer: Self-pay

## 2020-06-04 ENCOUNTER — Other Ambulatory Visit (INDEPENDENT_AMBULATORY_CARE_PROVIDER_SITE_OTHER): Payer: PRIVATE HEALTH INSURANCE

## 2020-06-04 DIAGNOSIS — Z0000 Encounter for general adult medical examination without abnormal findings: Secondary | ICD-10-CM | POA: Diagnosis not present

## 2020-06-04 LAB — COMPREHENSIVE METABOLIC PANEL
ALT: 8 U/L (ref 0–53)
AST: 15 U/L (ref 0–37)
Albumin: 4.5 g/dL (ref 3.5–5.2)
Alkaline Phosphatase: 40 U/L (ref 39–117)
BUN: 14 mg/dL (ref 6–23)
CO2: 28 mEq/L (ref 19–32)
Calcium: 9.1 mg/dL (ref 8.4–10.5)
Chloride: 103 mEq/L (ref 96–112)
Creatinine, Ser: 0.74 mg/dL (ref 0.40–1.50)
GFR: 126.13 mL/min (ref >60.00)
Glucose, Bld: 73 mg/dL (ref 70–99)
Potassium: 4.2 mEq/L (ref 3.5–5.1)
Sodium: 137 mEq/L (ref 135–145)
Total Bilirubin: 0.7 mg/dL (ref 0.2–1.2)
Total Protein: 7.1 g/dL (ref 6.0–8.3)

## 2020-06-04 LAB — CBC
HCT: 39.9 % (ref 39.0–52.0)
Hemoglobin: 13.5 g/dL (ref 13.0–17.0)
MCHC: 33.9 g/dL (ref 30.0–36.0)
MCV: 93.1 fl (ref 78.0–100.0)
Platelets: 320 10*3/uL (ref 150.0–400.0)
RBC: 4.29 Mil/uL (ref 4.22–5.81)
RDW: 12.4 % (ref 11.5–15.5)
WBC: 8.9 10*3/uL (ref 4.0–10.5)

## 2020-06-04 LAB — LIPID PANEL
Cholesterol: 195 mg/dL (ref 0–200)
HDL: 56.5 mg/dL (ref >39.00)
LDL Cholesterol: 117 mg/dL — ABNORMAL HIGH (ref 0–99)
NonHDL: 138.86
Total CHOL/HDL Ratio: 3
Triglycerides: 111 mg/dL (ref 0.0–149.0)
VLDL: 22.2 mg/dL (ref 0.0–40.0)

## 2020-06-05 LAB — HIV ANTIBODY (ROUTINE TESTING W REFLEX): HIV 1&2 Ab, 4th Generation: NONREACTIVE

## 2020-06-26 ENCOUNTER — Encounter: Payer: PRIVATE HEALTH INSURANCE | Admitting: Endocrinology

## 2020-06-26 NOTE — Progress Notes (Signed)
This encounter was created in error - please disregard.

## 2020-07-22 ENCOUNTER — Ambulatory Visit: Payer: PRIVATE HEALTH INSURANCE | Admitting: Endocrinology

## 2020-10-10 ENCOUNTER — Encounter: Payer: Self-pay | Admitting: Emergency Medicine

## 2020-10-10 ENCOUNTER — Ambulatory Visit
Admission: EM | Admit: 2020-10-10 | Discharge: 2020-10-10 | Disposition: A | Discharge status: Home / Self Care | Payer: Commercial Managed Care - PPO

## 2020-10-10 ENCOUNTER — Other Ambulatory Visit: Payer: Self-pay

## 2020-10-10 DIAGNOSIS — Z20822 Contact with and (suspected) exposure to covid-19: Secondary | ICD-10-CM

## 2020-10-10 DIAGNOSIS — R519 Headache, unspecified: Secondary | ICD-10-CM

## 2020-10-10 NOTE — Discharge Instructions (Addendum)
Zyrtec, flonase daily  Important to keep a log of your headaches: When they start, what alleviates them, pain on a scale of 1-10. Bring your headache log to your primary care for further evaluation as you may need to be on medications to help prevent headaches. Go to ER for worse headache of life, loss/change of vision, vomiting, fever, ear ringing, dizziness, weakness, facial droop/slurred speech, severe abdominal pain.

## 2020-10-10 NOTE — ED Provider Notes (Signed)
[facility]    CSN: [id number] Arrival date & time: 10/10/20  1250      History   Chief Complaint Chief Complaint  Patient presents with  . Fever  . Migraine    HPI Austin Vargas is a 30 y.o. adult  Presenting for Covid testing: Exposure: coworker Date of exposure: yesterday Any fever, symptoms since exposure: Yes-migraine, postnasal drip, fever.  Using saline nasal spray with some relief.  Past Medical History:  Diagnosis Date  . Allergy   . Anxiety   . Bicuspid aortic valve   . GERD (gastroesophageal reflux disease)   . GERD (gastroesophageal reflux disease)   . Heart murmur   . Patent foramen ovale   . Syncope -neurocardiogenic    He underwent extensive evaluation by [facility]. This included an ECG, Holter monitor, echocardiogram, cranial CT scan, and tilt table testing. His echocardiogram was remarkable for a PFO by bubble study. He also appeared to have a bicuspid aortic valve but had no significant gradient.  Holter neg for significant arrhythmias. 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 Hyperthyroidism 02/25/2015  . Palpitations 11/27/2014  . Migraines 11/27/2014  . GERD (gastroesophageal reflux disease) 06/06/2014  . Bicuspid aortic valve 06/06/2014  . Healthcare maintenance 02/17/2013  . Neurocardiogenic syncope     Past Surgical History:  Procedure Laterality Date  . APPENDECTOMY    . EYE SURGERY Bilateral        Home Medications    Prior to Admission medications   Medication Sig Start Date End Date Taking? Authorizing Provider  estradiol (ESTRACE) 0.5 MG tablet Take 0.5 tablets by mouth daily.    [provider]  progesterone (PROMETRIUM) 100 MG capsule Take 100 mg by mouth daily.    [provider]  spironolactone (ALDACTONE) 100 MG tablet Take 100 tablets by mouth 2 (two) times  daily. 07/05/19   [provider]    Family History Family History  Problem Relation Age of Onset  . Heart disease Maternal Grandmother   . Breast cancer Other        maternal great grandmother  . Diabetes Paternal Grandmother   . Heart disease Other        maternal great grandmother  . Colon cancer Neg Hx   . Throat cancer Neg Hx   . Esophageal cancer Neg Hx   . Rectal cancer Neg Hx   . Stomach cancer Neg Hx     Social History Social History   Tobacco Use  . Smoking status: Never Smoker  . Smokeless tobacco: Never Used  Vaping Use  . Vaping Use: Never used  Substance Use Topics  . Alcohol use: Yes    Alcohol/week: 1.0 standard drink    Types: 1 Shots of liquor per week  . Drug use: No     Allergies   Prednisone, Hydrocodone-acetaminophen, Paxil [paroxetine hcl], and Vicodin [hydrocodone-acetaminophen]   Review of Systems Review of Systems  Constitutional: Positive for fever. Negative for fatigue.  HENT: Positive for postnasal drip. Negative for congestion, dental problem, ear pain, facial swelling, hearing loss, sinus pain, sore throat, trouble swallowing and voice change.   Eyes: Negative for photophobia, pain and visual disturbance.  Respiratory: Negative for cough and shortness of breath.   Cardiovascular: Negative for chest pain and palpitations.  Gastrointestinal: Negative for diarrhea and vomiting.  Musculoskeletal: Negative for arthralgias and myalgias.  Neurological: Positive for headaches. Negative for dizziness.     Physical Exam Triage Vital Signs ED Triage Vitals  Enc Vitals Group     BP 10/10/20 1303 115/78     Pulse Rate 10/10/20 1303 85     Resp 10/10/20 1303 17     Temp 10/10/20 1303 98.2 F (36.8 C)     Temp Source 10/10/20 1303 Oral     SpO2 10/10/20 1303 96 %     Weight --      Height --      Head Circumference --      Peak Flow --      Pain Score 10/10/20 1301 5     Pain Loc --      Pain Edu? --      Excl. in GC? --     No data found.  Updated Vital Signs BP 115/78 (BP Location: Left Arm)   Pulse 85   Temp 98.2 F (36.8 C) (Oral)   Resp 17   SpO2 96%   Visual Acuity Right Eye Distance:   Left Eye Distance:   Bilateral Distance:    Right Eye Near:   Left Eye Near:    Bilateral Near:     Physical Exam Constitutional:      General: Austin Vargas is not in acute distress.    Appearance: Austin Vargas is not toxic-appearing or diaphoretic.  HENT:     Head: Normocephalic and atraumatic.     Right Ear: Tympanic membrane and ear canal normal.     Left Ear: Tympanic membrane and ear canal normal.     Mouth/Throat:     Mouth: Mucous membranes are moist.     Pharynx: Oropharynx is clear.  Eyes:     General: No scleral icterus.    Conjunctiva/sclera: Conjunctivae normal.     Pupils: Pupils are equal, round, and reactive to light.  Neck:     Comments: Trachea midline, negative JVD Cardiovascular:     Rate and Rhythm: Normal rate and regular rhythm.  Pulmonary:     Effort: Pulmonary effort is normal. No respiratory distress.     Breath sounds: No wheezing.  Musculoskeletal:     Cervical back: Neck supple. No tenderness.  Lymphadenopathy:     Cervical: No cervical adenopathy.  Skin:    Capillary Refill: Capillary refill takes less than 2 seconds.     Coloration: Skin is not jaundiced or pale.     Findings: No rash.  Neurological:     Mental Status: Austin Vargas is alert and oriented to person, place, and time.      UC Treatments / Results  Labs (all labs ordered are listed, but only abnormal results are displayed) Labs Reviewed  NOVEL CORONAVIRUS, NAA    EKG   Radiology No results found.  Procedures Procedures (including critical care time)  Medications Ordered in UC Medications - No data to display  Initial Impression / Assessment and Plan / UC Course  I have reviewed the triage vital signs and the nursing notes.  Pertinent labs & imaging results  that were available during my care of the patient were reviewed by me and considered in my medical decision making (see chart for details).     Patient afebrile, nontoxic, with SpO2 96%.  Covid PCR pending.  Patient to quarantine until results are back.  We will treat supportively as outlined below.  Return precautions discussed, patient verbalized understanding and is agreeable to plan. Final Clinical  Impressions(s) / UC Diagnoses   Final diagnoses:  Encounter for screening laboratory testing for COVID-19 virus  Frontal headache     Discharge Instructions     Zyrtec, flonase daily  Important to keep a log of your headaches: When they start, what alleviates them, pain on a scale of 1-10. Bring your headache log to your primary care for further evaluation as you may need to be on medications to help prevent headaches. Go to ER for worse headache of life, loss/change of vision, vomiting, fever, ear ringing, dizziness, weakness, facial droop/slurred speech, severe abdominal pain.    ED Prescriptions    None     PDMP not reviewed this encounter.   Hall-Potvin, [place], [place] 10/10/20 1418

## 2020-10-10 NOTE — ED Triage Notes (Signed)
Pt states that he has been experiencing HA, fever, and nasal congestion for about two days.

## 2020-10-11 LAB — NOVEL CORONAVIRUS, NAA: SARS-CoV-2, NAA: NOT DETECTED

## 2020-10-11 LAB — SARS-COV-2, NAA 2 DAY TAT

## 2020-10-19 ENCOUNTER — Emergency Department (HOSPITAL_COMMUNITY): Payer: Commercial Managed Care - PPO

## 2020-10-19 ENCOUNTER — Other Ambulatory Visit: Payer: Self-pay

## 2020-10-19 ENCOUNTER — Encounter (HOSPITAL_COMMUNITY): Payer: Self-pay | Admitting: Obstetrics and Gynecology

## 2020-10-19 ENCOUNTER — Emergency Department (HOSPITAL_COMMUNITY)
Admission: EM | Admit: 2020-10-19 | Discharge: 2020-10-19 | Disposition: A | Discharge status: Home / Self Care | Payer: Commercial Managed Care - PPO | Attending: Emergency Medicine | Admitting: Emergency Medicine

## 2020-10-19 DIAGNOSIS — K219 Gastro-esophageal reflux disease without esophagitis: Secondary | ICD-10-CM | POA: Diagnosis not present

## 2020-10-19 DIAGNOSIS — R112 Nausea with vomiting, unspecified: Secondary | ICD-10-CM | POA: Diagnosis not present

## 2020-10-19 DIAGNOSIS — Z20822 Contact with and (suspected) exposure to covid-19: Secondary | ICD-10-CM | POA: Insufficient documentation

## 2020-10-19 LAB — CBC WITH DIFFERENTIAL/PLATELET
Abs Immature Granulocytes: 0.09 10*3/uL — ABNORMAL HIGH (ref 0.00–0.07)
Basophils Absolute: 0 10*3/uL (ref 0.0–0.1)
Basophils Relative: 0 %
Eosinophils Absolute: 0.1 10*3/uL (ref 0.0–0.5)
Eosinophils Relative: 1 %
HCT: 40.3 % (ref 39.0–52.0)
Hemoglobin: 13.8 g/dL (ref 13.0–17.0)
Immature Granulocytes: 1 %
Lymphocytes Relative: 6 %
Lymphs Abs: 0.7 10*3/uL (ref 0.7–4.0)
MCH: 31 pg (ref 26.0–34.0)
MCHC: 34.2 g/dL (ref 30.0–36.0)
MCV: 90.6 fL (ref 80.0–100.0)
Monocytes Absolute: 1.1 10*3/uL — ABNORMAL HIGH (ref 0.1–1.0)
Monocytes Relative: 10 %
Neutro Abs: 9.3 10*3/uL — ABNORMAL HIGH (ref 1.7–7.7)
Neutrophils Relative %: 82 %
Platelets: 310 10*3/uL (ref 150–400)
RBC: 4.45 MIL/uL (ref 4.22–5.81)
RDW: 12.3 % (ref 11.5–15.5)
WBC: 11.2 10*3/uL — ABNORMAL HIGH (ref 4.0–10.5)
nRBC: 0 % (ref 0.0–0.2)

## 2020-10-19 LAB — COMPREHENSIVE METABOLIC PANEL
ALT: 13 U/L (ref 0–44)
AST: 16 U/L (ref 15–41)
Albumin: 4 g/dL (ref 3.5–5.0)
Alkaline Phosphatase: 40 U/L (ref 38–126)
Anion gap: 10 (ref 5–15)
BUN: 14 mg/dL (ref 6–20)
CO2: 26 mmol/L (ref 22–32)
Calcium: 8.6 mg/dL — ABNORMAL LOW (ref 8.9–10.3)
Chloride: 101 mmol/L (ref 98–111)
Creatinine, Ser: 0.7 mg/dL (ref 0.61–1.24)
GFR, Estimated: >60 mL/min (ref >60)
Glucose, Bld: 106 mg/dL — ABNORMAL HIGH (ref 70–99)
Potassium: 3.7 mmol/L (ref 3.5–5.1)
Sodium: 137 mmol/L (ref 135–145)
Total Bilirubin: 0.8 mg/dL (ref 0.3–1.2)
Total Protein: 7.2 g/dL (ref 6.5–8.1)

## 2020-10-19 LAB — LACTIC ACID, PLASMA: Lactic Acid, Venous: 0.9 mmol/L (ref 0.5–1.9)

## 2020-10-19 MED ORDER — ONDANSETRON 4 MG PO TBDP: 4.0000 mg | ORAL_TABLET | Freq: Three times a day (TID) | ORAL | 0 refills | Status: DC | PRN

## 2020-10-19 MED ORDER — ACETAMINOPHEN 325 MG PO TABS
650.0000 mg | ORAL_TABLET | Freq: Once | ORAL | Status: AC
  Administered 2020-10-19: 650 mg via ORAL
  Filled 2020-10-19: qty 2

## 2020-10-19 MED ORDER — ONDANSETRON 4 MG PO TBDP
4.0000 mg | ORAL_TABLET | Freq: Once | ORAL | Status: AC
  Administered 2020-10-19: 4 mg via ORAL
  Filled 2020-10-19: qty 1

## 2020-10-19 MED ORDER — DICYCLOMINE HCL 20 MG PO TABS: 20.0000 mg | ORAL_TABLET | Freq: Two times a day (BID) | ORAL | 0 refills | Status: DC

## 2020-10-19 NOTE — ED Provider Notes (Signed)
[facility]-EMERGENCY DEPT Provider Note   CSN: [id number] Arrival date & time: 10/19/20  1832     History Chief Complaint  Patient presents with  . Covid Exposure    Austin Vargas is a 30 y.o. adult.  HPI Patient is a 30 year old transfemale with a history of reflux, palpitations, migraines, anxiety, heart murmur Past surgical history is significant for appendicitis/appendectomy.  Patient is presented to the ER today with concerns of intermittent nausea and vomiting.  States that his symptoms have been ongoing for the past 2 or 3 days.  States that seem to come on without any obvious reasons.  States has been nonbloody nonbilious.  Seems to happen when she eats.  She does not feel nauseous when she does not eat.  He states he has been exposed to multiple people who have had Covid recently.  He states these were family members and one friend.  He states that he has received 2 doses of the Pfizer vaccine.  He states he has not had a booster.  He denies any cough, chest pain, shortness of breath, fevers, chills. She states that he has had no abdominal pain and no diarrhea.  States that she does not currently feel nauseous.  She states that she has not been able to tolerate p.o. therefore came to the ER for evaluation.  Also denies any dysuria, frequency or urgency.  No other associate symptoms.  No aggravating mitigating factors.  Has taken no medications prior to arrival.    Past Medical History:  Diagnosis Date  . Allergy   . Anxiety   . Bicuspid aortic valve   . GERD (gastroesophageal reflux disease)   . GERD (gastroesophageal reflux disease)   . Heart murmur   . Patent foramen ovale   . Syncope -neurocardiogenic    He underwent extensive evaluation by [facility]. This included an ECG, Holter monitor, echocardiogram, cranial CT scan, and tilt table testing. His echocardiogram was remarkable for a PFO by bubble study. He also  appeared to have a bicuspid aortic valve but had no significant gradient.  Holter neg for significant arrhythmias. 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 Hyperthyroidism 02/25/2015  . Palpitations 11/27/2014  . Migraines 11/27/2014  . GERD (gastroesophageal reflux disease) 06/06/2014  . Bicuspid aortic valve 06/06/2014  . Healthcare maintenance 02/17/2013  . Neurocardiogenic syncope     Past Surgical History:  Procedure Laterality Date  . APPENDECTOMY    . EYE SURGERY Bilateral        Family History  Problem Relation Age of Onset  . Heart disease Maternal Grandmother   . Breast cancer Other        maternal great grandmother  . Diabetes Paternal Grandmother   . Heart disease Other        maternal great grandmother  . Colon cancer Neg Hx   . Throat cancer Neg Hx   . Esophageal cancer Neg Hx   . Rectal cancer Neg Hx   . Stomach cancer Neg Hx     Social History   Tobacco Use  . Smoking status: Never Smoker  . Smokeless tobacco: Never Used  Vaping Use  . Vaping Use: Never used  Substance Use Topics  . Alcohol use: Yes    Alcohol/week: 1.0 standard drink    Types: 1 Shots of liquor per week  . Drug use: No  Home Medications Prior to Admission medications   Medication Sig Start Date End Date Taking? Authorizing Provider  dicyclomine (BENTYL) 20 MG tablet Take 1 tablet (20 mg total) by mouth 2 (two) times daily. 10/19/20  Yes Mistee Soliman S, PA  ondansetron (ZOFRAN ODT) 4 MG disintegrating tablet Take 1 tablet (4 mg total) by mouth every 8 (eight) hours as needed for nausea or vomiting. 10/19/20  Yes Kimbella Heisler, Stevphen Meuse S, PA  estradiol (ESTRACE) 0.5 MG tablet Take 0.5 tablets by mouth daily.    [provider]  progesterone (PROMETRIUM) 100 MG capsule Take 100 mg by mouth daily.    [provider]  spironolactone (ALDACTONE) 100 MG tablet Take 100  tablets by mouth 2 (two) times daily. 07/05/19   [provider]    Allergies    Prednisone, Hydrocodone-acetaminophen, Paxil [paroxetine hcl], and Vicodin [hydrocodone-acetaminophen]  Review of Systems   Review of Systems  Constitutional: Negative for chills and fever.  HENT: Negative for congestion.   Eyes: Negative for pain.  Respiratory: Negative for cough and shortness of breath.   Cardiovascular: Negative for chest pain and leg swelling.  Gastrointestinal: Positive for nausea and vomiting. Negative for abdominal pain.  Genitourinary: Negative for dysuria.  Musculoskeletal: Negative for myalgias.  Skin: Negative for rash.  Neurological: Negative for dizziness and headaches.    Physical Exam Updated Vital Signs BP 106/81   Pulse 92   Temp 99.9 F (37.7 C) (Oral)   Resp 15   SpO2 98%   Physical Exam Vitals and nursing note reviewed.  Constitutional:      General: Austin Vargas is not in acute distress.    Comments: Thin well-appearing 30 year old.  In no acute distress.  HENT:     Head: Normocephalic and atraumatic.     Nose: Nose normal.  Eyes:     General: No scleral icterus. Cardiovascular:     Rate and Rhythm: Normal rate and regular rhythm.     Pulses: Normal pulses.     Heart sounds: Normal heart sounds.     Comments: Bilateral 3+ and symmetric radial artery pulses Pulmonary:     Effort: Pulmonary effort is normal. No respiratory distress.     Breath sounds: Normal breath sounds. No wheezing.  Abdominal:     Palpations: Abdomen is soft.     Tenderness: There is no abdominal tenderness. There is no right CVA tenderness, left CVA tenderness, guarding or rebound.  Musculoskeletal:     Cervical back: Normal range of motion.     Right lower leg: No edema.     Left lower leg: No edema.  Skin:    General: Skin is warm and dry.     Capillary Refill: Capillary refill takes less than 2 seconds.  Neurological:     Mental Status: Austin Vargas  is alert. Mental status is at baseline.  Psychiatric:        Mood and Affect: Mood normal.        Behavior: Behavior normal.     ED Results / Procedures / Treatments   Labs (all labs ordered are listed, but only abnormal results are displayed) Labs Reviewed  COMPREHENSIVE METABOLIC PANEL - Abnormal; Notable for the following components:      Result Value   Glucose, Bld 106 (*)    Calcium 8.6 (*)    All other components within normal limits  CBC WITH DIFFERENTIAL/PLATELET - Abnormal; Notable for the following components:   WBC 11.2 (*)    Neutro  Abs 9.3 (*)    Monocytes Absolute 1.1 (*)    Abs Immature Granulocytes 0.09 (*)    All other components within normal limits  SARS CORONAVIRUS 2 (TAT 6-24 HRS)  LACTIC ACID, PLASMA  LACTIC ACID, PLASMA    EKG None  Radiology DG Chest 2 View  Result Date: 10/19/2020 CLINICAL DATA:  Cough, nausea and emesis EXAM: CHEST - 2 VIEW COMPARISON:  None FINDINGS: Trachea midline. Cardiomediastinal contours and hilar structures are normal. Lungs are clear. No sign of effusion. On limited assessment no acute skeletal process. IMPRESSION: No active cardiopulmonary disease. Electronically Signed   By: Donzetta Kohut M.D.   On: 10/19/2020 19:18    Procedures Procedures   Medications Ordered in ED Medications  ondansetron (ZOFRAN-ODT) disintegrating tablet 4 mg (4 mg Oral Given 10/19/20 2256)  acetaminophen (TYLENOL) tablet 650 mg (650 mg Oral Given 10/19/20 2256)    ED Course  I have reviewed the triage vital signs and the nursing notes.  Pertinent labs & imaging results that were available during my care of the patient were reviewed by me and considered in my medical decision making (see chart for details).    MDM Rules/Calculators/A&P                          Patient is a 30 year old transfemale presented today for nausea and vomiting.  Symptoms of been intermittent for the past 2 or 3 days.  No other significant associated  symptoms.  Physical exam is unremarkable.   I personally reviewed all laboratory work and imaging.  Metabolic panel without any acute abnormality specifically kidney function within normal limits and no significant electrolyte abnormalities. CBC with very mild leukocytosis 11.2 suspect this is from some mild dehydration--hemoglobin very mildly elevated from prior as well.  Lactic within normal limits.  Covid test pending.  Chest x-ray reviewed by myself is unremarkable.  Patient p.o. challenge after Zofran and Tylenol.  Kept down food and water without difficulty.  Repeat physical exam is without any abdominal tenderness.  Patient states that she feels significantly better.  Will discharge with Zofran, Bentyl, follow-up with PCP.   Austin Vargas was evaluated in Emergency Department on 10/20/2020 for the symptoms described in the history of present illness. Austin Vargas was evaluated in the context of the global COVID-19 pandemic, which necessitated consideration that the patient might be at risk for infection with the SARS-CoV-2 virus that causes COVID-19. Institutional protocols and algorithms that pertain to the evaluation of patients at risk for COVID-19 are in a state of rapid change based on information released by regulatory bodies including the CDC and federal and state organizations. These policies and algorithms were followed during the patient's care in the ED.  Final Clinical Impression(s) / ED Diagnoses Final diagnoses:  Non-intractable vomiting with nausea, unspecified vomiting type    Rx / DC Orders ED Discharge Orders         Ordered    ondansetron (ZOFRAN ODT) 4 MG disintegrating tablet  Every 8 hours PRN        10/19/20 2324    dicyclomine (BENTYL) 20 MG tablet  2 times daily        10/19/20 2325           Solon Augusta [place], [place] 10/20/20 0250    Zadie Rhine, MD 10/20/20 [phone]

## 2020-10-19 NOTE — ED Notes (Signed)
Send down a blue top

## 2020-10-19 NOTE — Discharge Instructions (Signed)
Please drink plenty of water.  Please take Zofran as needed for nausea. Please follow-up with your primary care doctor.  If you do not have a primary care doctor please call to make an appointment for him.  He may always return to the ER for any new or concerning symptoms such as we discussed.

## 2020-10-19 NOTE — ED Triage Notes (Signed)
Patient reports they have been having nausea, emesis, and potentially exposed to COVID.

## 2020-10-20 LAB — SARS CORONAVIRUS 2 (TAT 6-24 HRS): SARS Coronavirus 2: NEGATIVE

## 2020-12-01 ENCOUNTER — Ambulatory Visit
Admission: RE | Admit: 2020-12-01 | Discharge: 2020-12-01 | Disposition: A | Discharge status: Home / Self Care | Payer: Commercial Managed Care - PPO | Source: Ambulatory Visit | Attending: Family Medicine | Admitting: Family Medicine

## 2020-12-01 ENCOUNTER — Other Ambulatory Visit: Payer: Self-pay

## 2020-12-01 VITALS — BP 107/72 | HR 97 | Temp 98.5°F | Resp 20

## 2020-12-01 DIAGNOSIS — R0981 Nasal congestion: Secondary | ICD-10-CM

## 2020-12-01 DIAGNOSIS — H6692 Otitis media, unspecified, left ear: Secondary | ICD-10-CM | POA: Diagnosis not present

## 2020-12-01 MED ORDER — AMOXICILLIN 875 MG PO TABS: 875.0000 mg | ORAL_TABLET | Freq: Two times a day (BID) | ORAL | 0 refills | Status: DC

## 2020-12-01 MED ORDER — IBUPROFEN 600 MG PO TABS: 600.0000 mg | ORAL_TABLET | Freq: Four times a day (QID) | ORAL | 0 refills | Status: DC | PRN

## 2020-12-01 NOTE — ED Triage Notes (Signed)
Pt c/o lt ear pain today. States has post nasal drip since Saturday night.

## 2020-12-01 NOTE — Discharge Instructions (Addendum)
For pain you may take ibuprofen 600 mg 3 times daily.  For ear infection and placing on amoxicillin 875 twice daily for total of 10 days.  Continue Claritin daily for nasal symptoms.

## 2020-12-01 NOTE — ED Provider Notes (Addendum)
[facility]    CSN: [id number] Arrival date & time: 12/01/20  1745      History   Chief Complaint Chief Complaint  Patient presents with  . Otalgia    HPI Austin Vargas is a 30 y.o. adult.   HPI Patient presents today with acute onset left ear pain. He has been congested over the weekend and started taking Claritin yesterday.  No history of recurrent ear infections.  He has had no fever.  Past Medical History:  Diagnosis Date  . Allergy   . Anxiety   . Bicuspid aortic valve   . GERD (gastroesophageal reflux disease)   . GERD (gastroesophageal reflux disease)   . Heart murmur   . Patent foramen ovale   . Syncope -neurocardiogenic    He underwent extensive evaluation by [facility]. This included an ECG, Holter monitor, echocardiogram, cranial CT scan, and tilt table testing. His echocardiogram was remarkable for a PFO by bubble study. He also appeared to have a bicuspid aortic valve but had no significant gradient.  Holter neg for significant arrhythmias. 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 Hyperthyroidism 02/25/2015  . Palpitations 11/27/2014  . Migraines 11/27/2014  . GERD (gastroesophageal reflux disease) 06/06/2014  . Bicuspid aortic valve 06/06/2014  . Healthcare maintenance 02/17/2013  . Neurocardiogenic syncope     Past Surgical History:  Procedure Laterality Date  . APPENDECTOMY    . EYE SURGERY Bilateral        Home Medications    Prior to Admission medications   Medication Sig Start Date End Date Taking? Authorizing Provider  estradiol (ESTRACE) 0.5 MG tablet Take 0.5 tablets by mouth daily.    [provider]  ondansetron (ZOFRAN ODT) 4 MG disintegrating tablet Take 1 tablet (4 mg total) by mouth every 8 (eight) hours as needed for nausea or vomiting. 10/19/20   Gailen Shelter, PA   progesterone (PROMETRIUM) 100 MG capsule Take 100 mg by mouth daily.    [provider]    Family History Family History  Problem Relation Age of Onset  . Heart disease Maternal Grandmother   . Breast cancer Other        maternal great grandmother  . Diabetes Paternal Grandmother   . Heart disease Other        maternal great grandmother  . Colon cancer Neg Hx   . Throat cancer Neg Hx   . Esophageal cancer Neg Hx   . Rectal cancer Neg Hx   . Stomach cancer Neg Hx     Social History Social History   Tobacco Use  . Smoking status: Never Smoker  . Smokeless tobacco: Never Used  Vaping Use  . Vaping Use: Never used  Substance Use Topics  . Alcohol use: Yes    Alcohol/week: 1.0 standard drink    Types: 1 Shots of liquor per week  . Drug use: No     Allergies   Prednisone, Hydrocodone-acetaminophen, Paxil [paroxetine hcl], and Vicodin [hydrocodone-acetaminophen]   Review of Systems Review of Systems Pertinent negatives listed in HPI   Physical Exam Triage Vital Signs ED Triage Vitals  Enc Vitals Group     BP 12/01/20 1814 107/72     Pulse Rate 12/01/20 1814 97     Resp 12/01/20 1814 20     Temp 12/01/20 1814 98.5 F (36.9 C)  Temp Source 12/01/20 1814 Oral     SpO2 12/01/20 1814 97 %     Weight --      Height --      Head Circumference --      Peak Flow --      Pain Score 12/01/20 1823 9     Pain Loc --      Pain Edu? --      Excl. in GC? --    No data found.  Updated Vital Signs BP 107/72 (BP Location: Left Arm)   Pulse 97   Temp 98.5 F (36.9 C) (Oral)   Resp 20   SpO2 97%   Visual Acuity Right Eye Distance:   Left Eye Distance:   Bilateral Distance:    Right Eye Near:   Left Eye Near:    Bilateral Near:     Physical Exam Constitutional:      Appearance: Normal appearance.  HENT:     Right Ear: No middle ear effusion.     Left Ear: Swelling and tenderness present. A middle ear effusion is present. Tympanic membrane is  erythematous.     Nose: Mucosal edema, congestion and rhinorrhea present.  Cardiovascular:     Rate and Rhythm: Normal rate and regular rhythm.  Musculoskeletal:     Cervical back: Normal range of motion.  Skin:    Capillary Refill: Capillary refill takes less than 2 seconds.  Neurological:     General: No focal deficit present.     Mental Status: HOLBERT CAPLES is alert and oriented to person, place, and time.  Psychiatric:        Mood and Affect: Mood normal.        Behavior: Behavior normal.        Thought Content: Thought content normal.        Judgment: Judgment normal.    UC Treatments / Results  Labs (all labs ordered are listed, but only abnormal results are displayed) Labs Reviewed - No data to display  EKG   Radiology No results found.  Procedures Procedures (including critical care time)  Medications Ordered in UC Medications - No data to display  Initial Impression / Assessment and Plan / UC Course  I have reviewed the triage vital signs and the nursing notes.  Pertinent labs & imaging results that were available during my care of the patient were reviewed by me and considered in my medical decision making (see chart for details).      Treating for acute left otitis media cover with amoxicillin 875 twice daily for total 10 days.  Continue Claritin for nasal symptoms.  For acute pain I have prescribed you ibuprofen 600 mg you may take 3 times daily as needed. Final Clinical Impressions(s) / UC Diagnoses   Final diagnoses:  Acute left otitis media  Nasal congestion     Discharge Instructions     For pain you may take ibuprofen 600 mg 3 times daily.  For ear infection and placing on amoxicillin 875 twice daily for total of 10 days.  Continue Claritin daily for nasal symptoms.    ED Prescriptions    Medication Sig Dispense Auth. Provider   amoxicillin (AMOXIL) 875 MG tablet Take 1 tablet (875 mg total) by mouth 2 (two) times daily. 20 tablet  Bing Neighbors, FNP   ibuprofen (ADVIL) 600 MG tablet Take 1 tablet (600 mg total) by mouth every 6 (six) hours as needed. 30 tablet Bing Neighbors, FNP  PDMP not reviewed this encounter.   Bing Neighbors, FNP 12/01/20 1934    Bing Neighbors, FNP 12/02/20 2200

## 2021-01-07 DIAGNOSIS — Z8709 Personal history of other diseases of the respiratory system: Secondary | ICD-10-CM | POA: Insufficient documentation

## 2021-01-07 DIAGNOSIS — R519 Headache, unspecified: Secondary | ICD-10-CM | POA: Insufficient documentation

## 2021-01-14 ENCOUNTER — Ambulatory Visit: Payer: Commercial Managed Care - PPO | Admitting: Internal Medicine

## 2021-01-21 ENCOUNTER — Ambulatory Visit: Payer: Commercial Managed Care - PPO | Admitting: Internal Medicine

## 2021-02-02 ENCOUNTER — Encounter: Payer: Self-pay | Admitting: Internal Medicine

## 2021-02-02 ENCOUNTER — Other Ambulatory Visit: Payer: Self-pay

## 2021-02-02 ENCOUNTER — Ambulatory Visit (INDEPENDENT_AMBULATORY_CARE_PROVIDER_SITE_OTHER): Payer: Commercial Managed Care - PPO

## 2021-02-02 ENCOUNTER — Ambulatory Visit: Payer: Commercial Managed Care - PPO | Admitting: Internal Medicine

## 2021-02-02 VITALS — BP 100/73 | HR 73 | Temp 97.3°F | Resp 12 | Ht 70.0 in | Wt 144.0 lb

## 2021-02-02 DIAGNOSIS — K581 Irritable bowel syndrome with constipation: Secondary | ICD-10-CM

## 2021-02-02 DIAGNOSIS — E0590 Thyrotoxicosis, unspecified without thyrotoxic crisis or storm: Secondary | ICD-10-CM

## 2021-02-02 DIAGNOSIS — L659 Nonscarring hair loss, unspecified: Secondary | ICD-10-CM | POA: Diagnosis not present

## 2021-02-02 DIAGNOSIS — Z7689 Persons encountering health services in other specified circumstances: Secondary | ICD-10-CM

## 2021-02-02 MED ORDER — POLYETHYLENE GLYCOL 3350 17 GM/SCOOP PO POWD: 17.0000 g | Freq: Two times a day (BID) | ORAL | 1 refills | Status: DC | PRN

## 2021-02-02 NOTE — Progress Notes (Signed)
Follow up rash adominal bloating, discomfort 3/10   constant x 2 weeks  nausaea Denies diarrhea or emesis

## 2021-02-02 NOTE — Progress Notes (Signed)
Subjective:    Austin Vargas - 30 y.o. adult MRN [id number]  Date of birth: 1990/11/17  HPI  Austin Vargas is to establish care. Was previously established with [facility]. Patient has a PMH significant for GERD, IBS. Followed by ENT for chronic congestion.   Has been bloated for the past couple weeks. Reports this is not common with IBS. However, has not been on medications for IBS for a long time. Used to be followed by GI. Has not been seen by specialist for 3-4 years. Is having some problems with constipation. Bowel movement every other day or so. Does endorse some straining. No frank blood in stool, only with wiping occasionally. Has been taking Gas-X for the past week.   ROS per HPI     Health Maintenance:  Health Maintenance Due  Topic Date Due  . Hepatitis C Screening  Never done  . PAP SMEAR-Modifier  Never done  . COVID-19 Vaccine (2 - Pfizer 3-dose series) 12/27/2019     Past Medical History: Patient Active Problem List   Diagnosis Date Noted  . 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 Hyperthyroidism 02/25/2015  . Palpitations 11/27/2014  . Migraines 11/27/2014  . GERD (gastroesophageal reflux disease) 06/06/2014  . Bicuspid aortic valve 06/06/2014  . Healthcare maintenance 02/17/2013  . Neurocardiogenic syncope       Social History   reports that Austin Vargas has never smoked. Austin Vargas has never used smokeless tobacco. Austin Vargas reports current alcohol use of about 1.0 standard drink of alcohol per week. Austin Vargas reports that Austin Vargas does not use drugs.   Family History  family history includes Breast cancer in an other family member; Diabetes in Austin Vargas's paternal grandmother; Heart disease in [place] E. Vargas's maternal grandmother and another family member.   Medications: reviewed and updated   Objective:   Physical Exam BP 100/73 (BP Location: Right  Arm, Patient Position: Sitting, Cuff Size: Normal)   Pulse 73   Temp (!) 97.3 F (36.3 C)   Resp 12   Ht 5\' 10"  (1.778 m)   Wt 144 lb (65.3 kg)   SpO2 96%   BMI 20.66 kg/m  Physical Exam Constitutional:      General: Austin Vargas is not in acute distress.    Appearance: Austin Vargas is not diaphoretic.  Cardiovascular:     Rate and Rhythm: Normal rate.  Pulmonary:     Effort: Pulmonary effort is normal. No respiratory distress.  Musculoskeletal:        General: Normal range of motion.  Skin:    General: Skin is warm and dry.  Neurological:     Mental Status: Austin Vargas is alert and oriented to person, place, and time.  Psychiatric:        Mood and Affect: Affect normal.        Judgment: Judgment normal.         Assessment & Plan:   1. Encounter to establish care Reviewed patient's PMH, social history, surgical history, and medications.   2. Hyperthyroidism Denies known h/o hyperthyroidism. Do not see any particularly concerning TSH results from the past. Will obtain today.  - TSH  3. Irritable bowel syndrome with constipation Obtain abdominal plain film to evaluate stool burden. Start Miralax. Establish with GI given history of IBS.  - Ambulatory referral to Gastroenterology - polyethylene glycol powder (GLYCOLAX/MIRALAX) 17 GM/SCOOP powder; Take 17 g by mouth 2 (two)  times daily as needed.  Dispense: 3350 g; Refill: 1 - DG Abd 1 View  4. Hair loss Continue with Rogaine. Discussed that needs 6 month trial. If no improvement with this, could consider PO medication such as Finasteride.      Marcy Siren, D.O. 02/02/2021, 10:41 AM Primary Care at [facility]

## 2021-02-03 LAB — TSH: TSH: 0.645 u[IU]/mL (ref 0.450–4.500)

## 2021-03-23 ENCOUNTER — Ambulatory Visit: Payer: Commercial Managed Care - PPO

## 2021-03-23 ENCOUNTER — Emergency Department (HOSPITAL_BASED_OUTPATIENT_CLINIC_OR_DEPARTMENT_OTHER)
Admission: EM | Admit: 2021-03-23 | Discharge: 2021-03-23 | Disposition: A | Discharge status: Home / Self Care | Payer: Commercial Managed Care - PPO | Attending: Emergency Medicine | Admitting: Emergency Medicine

## 2021-03-23 ENCOUNTER — Other Ambulatory Visit: Payer: Self-pay

## 2021-03-23 ENCOUNTER — Emergency Department (HOSPITAL_BASED_OUTPATIENT_CLINIC_OR_DEPARTMENT_OTHER): Payer: Commercial Managed Care - PPO

## 2021-03-23 ENCOUNTER — Encounter: Payer: Self-pay | Admitting: Emergency Medicine

## 2021-03-23 ENCOUNTER — Ambulatory Visit
Admission: EM | Admit: 2021-03-23 | Discharge: 2021-03-23 | Disposition: A | Discharge status: Home / Self Care | Payer: Commercial Managed Care - PPO

## 2021-03-23 ENCOUNTER — Encounter (HOSPITAL_BASED_OUTPATIENT_CLINIC_OR_DEPARTMENT_OTHER): Payer: Self-pay | Admitting: Obstetrics and Gynecology

## 2021-03-23 DIAGNOSIS — H53149 Visual discomfort, unspecified: Secondary | ICD-10-CM

## 2021-03-23 DIAGNOSIS — R410 Disorientation, unspecified: Secondary | ICD-10-CM | POA: Diagnosis not present

## 2021-03-23 DIAGNOSIS — W208XXA Other cause of strike by thrown, projected or falling object, initial encounter: Secondary | ICD-10-CM | POA: Insufficient documentation

## 2021-03-23 DIAGNOSIS — S060X0A Concussion without loss of consciousness, initial encounter: Secondary | ICD-10-CM | POA: Insufficient documentation

## 2021-03-23 DIAGNOSIS — R42 Dizziness and giddiness: Secondary | ICD-10-CM

## 2021-03-23 DIAGNOSIS — S0990XA Unspecified injury of head, initial encounter: Secondary | ICD-10-CM | POA: Diagnosis not present

## 2021-03-23 MED ORDER — ONDANSETRON 4 MG PO TBDP: 4.0000 mg | ORAL_TABLET | Freq: Three times a day (TID) | ORAL | 0 refills | Status: DC | PRN

## 2021-03-23 MED ORDER — LACTATED RINGERS IV BOLUS
1000.0000 mL | Freq: Once | INTRAVENOUS | Status: AC
  Administered 2021-03-23: 1000 mL via INTRAVENOUS

## 2021-03-23 MED ORDER — DIPHENHYDRAMINE HCL 50 MG/ML IJ SOLN
25.0000 mg | Freq: Once | INTRAMUSCULAR | Status: AC
  Administered 2021-03-23: 25 mg via INTRAVENOUS
  Filled 2021-03-23: qty 1

## 2021-03-23 MED ORDER — METOCLOPRAMIDE HCL 5 MG/ML IJ SOLN
10.0000 mg | Freq: Once | INTRAMUSCULAR | Status: AC
  Administered 2021-03-23: 10 mg via INTRAVENOUS
  Filled 2021-03-23: qty 2

## 2021-03-23 MED ORDER — KETOROLAC TROMETHAMINE 15 MG/ML IJ SOLN
15.0000 mg | Freq: Once | INTRAMUSCULAR | Status: AC
  Administered 2021-03-23: 15 mg via INTRAVENOUS
  Filled 2021-03-23: qty 1

## 2021-03-23 NOTE — ED Provider Notes (Signed)
[facility] EMERGENCY DEPT Provider Note   CSN: [id number] Arrival date & time: 03/23/21  1102     History Chief Complaint  Patient presents with   Headache   Head Injury    Austin Vargas is a 30 y.o. adult.  HPI 30 year old patient presents with headache/head injury.  2 days ago patient dropped a wrench on their head.  Did not lose consciousness but has pain to the forehead were the wrench hit.  Since then has had mildly progressive headache that is bitemporal as well as some dizziness and nausea.  Sometimes has a hard time putting words together.  Has taken ibuprofen.  Some photophobia and phonophobia.  No vomiting, weakness or numbness in the extremities.  Is not on blood thinners.  Went to urgent care was sent here.  Feels like a migraine headache but stronger.  Past Medical History:  Diagnosis Date   Allergy    Anxiety    Bicuspid aortic valve    GERD (gastroesophageal reflux disease)    GERD (gastroesophageal reflux disease)    Heart murmur    Patent foramen ovale    Syncope -neurocardiogenic    He underwent extensive evaluation by [facility]. This included an ECG, Holter monitor, echocardiogram, cranial CT scan, and tilt table testing. His echocardiogram was remarkable for a PFO by bubble study. He also appeared to have a bicuspid aortic valve but had no significant gradient.  Holter neg for significant arrhythmias. Tilt test + for neurocardiogenic syncope    Patient Active Problem List   Diagnosis Date Noted   Male-to-male transgender person 06/03/2020   Palpitations 11/27/2014   Migraines 11/27/2014   GERD (gastroesophageal reflux disease) 06/06/2014   Bicuspid aortic valve 06/06/2014   Neurocardiogenic syncope     Past Surgical History:  Procedure Laterality Date   APPENDECTOMY     EYE SURGERY Bilateral        Family History  Problem Relation Age of Onset   Heart disease Maternal Grandmother    Breast cancer  Other        maternal great grandmother   Diabetes Paternal Grandmother    Heart disease Other        maternal great grandmother   Colon cancer Neg Hx    Throat cancer Neg Hx    Esophageal cancer Neg Hx    Rectal cancer Neg Hx    Stomach cancer Neg Hx     Social History   Tobacco Use   Smoking status: Never   Smokeless tobacco: Never  Vaping Use   Vaping Use: Never used  Substance Use Topics   Alcohol use: Yes    Alcohol/week: 1.0 standard drink    Types: 1 Shots of liquor per week    Comment: Social   Drug use: No    Home Medications Prior to Admission medications   Medication Sig Start Date End Date Taking? Authorizing Provider  ondansetron (ZOFRAN ODT) 4 MG disintegrating tablet Take 1 tablet (4 mg total) by mouth every 8 (eight) hours as needed for nausea or vomiting. 03/23/21  Yes Pricilla Loveless, MD  estradiol (ESTRACE) 0.5 MG tablet Take 0.5 tablets by mouth daily.    [provider]  ibuprofen (ADVIL) 600 MG tablet Take 1 tablet (600 mg total) by mouth every 6 (six) hours as needed. 12/01/20   Bing Neighbors, FNP  polyethylene glycol powder (GLYCOLAX/MIRALAX) 17 GM/SCOOP powder Take 17 g by mouth 2 (two) times daily as needed. 02/02/21   Earlene Plater,  Kandee Keen, MD  progesterone (PROMETRIUM) 100 MG capsule Take 100 mg by mouth daily.    [provider]    Allergies    Prednisone, Hydrocodone-acetaminophen, Paxil [paroxetine hcl], and Vicodin [hydrocodone-acetaminophen]  Review of Systems   Review of Systems  Constitutional:  Negative for fever.  Eyes:  Positive for photophobia. Negative for visual disturbance.  Gastrointestinal:  Positive for nausea. Negative for vomiting.  Neurological:  Positive for dizziness and headaches. Negative for weakness and numbness.  All other systems reviewed and are negative.  Physical Exam Updated Vital Signs BP 125/84 (BP Location: Right Arm)   Pulse 79   Temp 98.7 F (37.1 C) (Oral)   Resp 15   Ht 5'  7" (1.702 m)   Wt 65.8 kg   SpO2 100%   BMI 22.71 kg/m   Physical Exam Vitals and nursing note reviewed.  Constitutional:      General: Austin Vargas is not in acute distress.    Appearance: Austin Vargas is well-developed. Austin Vargas is not ill-appearing or diaphoretic.  HENT:     Head: Normocephalic. Contusion present.      Right Ear: External ear normal.     Left Ear: External ear normal.     Nose: Nose normal.  Eyes:     General:        Right eye: No discharge.        Left eye: No discharge.     Extraocular Movements: Extraocular movements intact.     Pupils: Pupils are equal, round, and reactive to light.  Cardiovascular:     Rate and Rhythm: Normal rate and regular rhythm.     Heart sounds: Normal heart sounds.  Pulmonary:     Effort: Pulmonary effort is normal.     Breath sounds: Normal breath sounds.  Abdominal:     Palpations: Abdomen is soft.     Tenderness: There is no abdominal tenderness.  Musculoskeletal:     Cervical back: Neck supple.  Skin:    General: Skin is warm and dry.  Neurological:     Mental Status: Austin Vargas is alert.     Comments: CN 3-12 grossly intact. 5/5 strength in all 4 extremities. Grossly normal sensation. Normal finger to nose.   Psychiatric:        Mood and Affect: Mood is not anxious.    ED Results / Procedures / Treatments   Labs (all labs ordered are listed, but only abnormal results are displayed) Labs Reviewed - No data to display  EKG None  Radiology CT Head Wo Contrast  Result Date: 03/23/2021 CLINICAL DATA:  Head trauma.  Dizziness with nausea and photophobia. EXAM: CT HEAD WITHOUT CONTRAST TECHNIQUE: Contiguous axial images were obtained from the base of the skull through the vertex without intravenous contrast. COMPARISON:  None. FINDINGS: Brain: There is no evidence for acute hemorrhage, hydrocephalus, mass lesion, or abnormal extra-axial fluid collection. No definite CT evidence for acute  infarction. Vascular: No hyperdense vessel or unexpected calcification. Skull: No evidence for fracture. No worrisome lytic or sclerotic lesion. Sinuses/Orbits: The visualized paranasal sinuses and mastoid air cells are clear. Visualized portions of the globes and intraorbital fat are unremarkable. Other: None. IMPRESSION: Unremarkable exam.  No acute intracranial abnormality. Electronically Signed   By: Kennith Center M.D.   On: 03/23/2021 13:28    Procedures Procedures   Medications Ordered in ED Medications  metoCLOPramide (REGLAN) injection 10 mg (10 mg Intravenous Given 03/23/21 1145)  diphenhydrAMINE (  BENADRYL) injection 25 mg (25 mg Intravenous Given 03/23/21 1147)  ketorolac (TORADOL) 15 MG/ML injection 15 mg (15 mg Intravenous Given 03/23/21 1146)  lactated ringers bolus 1,000 mL (1,000 mLs Intravenous New Bag/Given 03/23/21 1144)    ED Course  I have reviewed the triage vital signs and the nursing notes.  Pertinent labs & imaging results that were available during my care of the patient were reviewed by me and considered in my medical decision making (see chart for details).    MDM Rules/Calculators/A&P                          Patient's symptoms are most consistent with a concussion.  I had multiple long discussions with patient about head CT or not and we discussed options and shared decision making.  At first patient just wanted headache treatment and no CT but then later changed their mind and wanted to head CT.  This is negative.  Will treat symptomatically as a concussion at home with supportive care and follow-up with PCP. Final Clinical Impression(s) / ED Diagnoses Final diagnoses:  Concussion without loss of consciousness, initial encounter    Rx / DC Orders ED Discharge Orders          Ordered    ondansetron (ZOFRAN ODT) 4 MG disintegrating tablet  Every 8 hours PRN        03/23/21 1333             Pricilla Loveless, MD 03/23/21 1556

## 2021-03-23 NOTE — ED Triage Notes (Signed)
Patient reports to the ER for head injury. Patient reports they were hit on the head with a wrench on Saturday. Endorses nausea, states they did not have LOC with incident. Sent from [facility] for possible head CT.

## 2021-03-23 NOTE — Discharge Instructions (Addendum)
If you develop new or worsening headache, vomiting, vision changes, weakness or numbness in extremities, or any other new/concerning symptoms then return to the ER for evaluation.

## 2021-03-23 NOTE — ED Provider Notes (Signed)
[facility]    CSN: [id number] Arrival date & time: 03/23/21  0850      History   Chief Complaint Chief Complaint  Patient presents with   Head Injury    HPI Austin Vargas is a 30 y.o. adult.   Patient presenting today with 2-day history of dizziness, grogginess, nausea, photophobia, disorientation, slightly slurred speech, significant headache since having a wrench fall on head.  States the wrench hit forehead, had immediate pain but the other symptoms have increasingly worsened since incident.  Denies vomiting, syncope, extremity weakness or numbness, worse headache of life, loss of vision.  Trying over-the-counter pain relievers with minimal relief.  No past history of head trauma or concussion patient is aware of.   Past Medical History:  Diagnosis Date   Allergy    Anxiety    Bicuspid aortic valve    GERD (gastroesophageal reflux disease)    GERD (gastroesophageal reflux disease)    Heart murmur    Patent foramen ovale    Syncope -neurocardiogenic    He underwent extensive evaluation by [facility]. This included an ECG, Holter monitor, echocardiogram, cranial CT scan, and tilt table testing. His echocardiogram was remarkable for a PFO by bubble study. He also appeared to have a bicuspid aortic valve but had no significant gradient.  Holter neg for significant arrhythmias. Tilt test + for neurocardiogenic syncope    Patient Active Problem List   Diagnosis Date Noted   Male-to-male transgender person 06/03/2020   Palpitations 11/27/2014   Migraines 11/27/2014   GERD (gastroesophageal reflux disease) 06/06/2014   Bicuspid aortic valve 06/06/2014   Neurocardiogenic syncope     Past Surgical History:  Procedure Laterality Date   APPENDECTOMY     EYE SURGERY Bilateral        Home Medications    Prior to Admission medications   Medication Sig Start Date End Date Taking? Authorizing Provider  estradiol (ESTRACE) 0.5 MG  tablet Take 0.5 tablets by mouth daily.   Yes [provider]  ibuprofen (ADVIL) 600 MG tablet Take 1 tablet (600 mg total) by mouth every 6 (six) hours as needed. 12/01/20  Yes Bing Neighbors, FNP  progesterone (PROMETRIUM) 100 MG capsule Take 100 mg by mouth daily.   Yes [provider]  ondansetron (ZOFRAN ODT) 4 MG disintegrating tablet Take 1 tablet (4 mg total) by mouth every 8 (eight) hours as needed for nausea or vomiting. Patient not taking: No sig reported 10/19/20   Solon Augusta S, PA  polyethylene glycol powder (GLYCOLAX/MIRALAX) 17 GM/SCOOP powder Take 17 g by mouth 2 (two) times daily as needed. 02/02/21   Arvilla Market, MD    Family History Family History  Problem Relation Age of Onset   Heart disease Maternal Grandmother    Breast cancer Other        maternal great grandmother   Diabetes Paternal Grandmother    Heart disease Other        maternal great grandmother   Colon cancer Neg Hx    Throat cancer Neg Hx    Esophageal cancer Neg Hx    Rectal cancer Neg Hx    Stomach cancer Neg Hx     Social History Social History   Tobacco Use   Smoking status: Never   Smokeless tobacco: Never  Vaping Use   Vaping Use: Never used  Substance Use Topics   Alcohol use: Yes    Alcohol/week: 1.0 standard drink    Types:  1 Shots of liquor per week   Drug use: No     Allergies   Prednisone, Hydrocodone-acetaminophen, Paxil [paroxetine hcl], and Vicodin [hydrocodone-acetaminophen]   Review of Systems Review of Systems Per HPI  Physical Exam Triage Vital Signs ED Triage Vitals [03/23/21 0923]  Enc Vitals Group     BP (!) 148/75     Pulse Rate 88     Resp 14     Temp 97.8 F (36.6 C)     Temp Source Oral     SpO2 96 %     Weight      Height      Head Circumference      Peak Flow      Pain Score 5     Pain Loc      Pain Edu?      Excl. in GC?    No data found.  Updated Vital Signs BP (!) 148/75 (BP Location: Left Arm)    Pulse 88   Temp 97.8 F (36.6 C) (Oral)   Resp 14   SpO2 96%   Visual Acuity Right Eye Distance:   Left Eye Distance:   Bilateral Distance:    Right Eye Near:   Left Eye Near:    Bilateral Near:     Physical Exam Vitals and nursing note reviewed.  Constitutional:      General: Austin Vargas is not in acute distress.    Comments: Mildly lethargic affect  HENT:     Head: Atraumatic.  Eyes:     Extraocular Movements: Extraocular movements intact.     Conjunctiva/sclera: Conjunctivae normal.     Pupils: Pupils are equal, round, and reactive to light.  Cardiovascular:     Rate and Rhythm: Normal rate and regular rhythm.  Pulmonary:     Effort: Pulmonary effort is normal.     Breath sounds: Normal breath sounds.  Abdominal:     General: Bowel sounds are normal. There is no distension.     Palpations: Abdomen is soft.     Tenderness: There is no abdominal tenderness. There is no guarding.  Musculoskeletal:        General: Tenderness present. Normal range of motion.     Cervical back: Normal range of motion and neck supple.     Comments: Tender to palpation frontal region at site of impact  Skin:    General: Skin is warm and dry.     Findings: No erythema.  Neurological:     Mental Status: Austin Vargas is oriented to person, place, and time.     Motor: No weakness.     Gait: Gait normal.     Comments: Response times slow, but answering questions appropriately.  No obvious focal deficit on exam today  Psychiatric:        Mood and Affect: Mood normal.        Thought Content: Thought content normal.        Judgment: Judgment normal.   UC Treatments / Results  Labs (all labs ordered are listed, but only abnormal results are displayed) Labs Reviewed - No data to display  EKG   Radiology No results found.  Procedures Procedures (including critical care time)  Medications Ordered in UC Medications - No data to display  Initial Impression / Assessment  and Plan / UC Course  I have reviewed the triage vital signs and the nursing notes.  Pertinent labs & imaging results that were available during my care of the  patient were reviewed by me and considered in my medical decision making (see chart for details).     No obvious red flag findings on exam today, but given persistent worsening symptoms following a head trauma 2 days ago will refer to the emergency department for further evaluation.  Discussed with patient possibly concussive syndrome but cannot rule out bleeds or other intracranial emergency in the situation.  Patient agreeable to going to the emergency department via private vehicle and will call for a ride.  Discussed at length not to drive self in current state.  Patient agreeable to plan.  Final Clinical Impressions(s) / UC Diagnoses   Final diagnoses:  Injury of head, initial encounter  Dizziness  Disorientation  Photophobia     Discharge Instructions      Have someone drive you to the emergency department for further evaluation as soon as possible     ED Prescriptions   None    PDMP not reviewed this encounter.   Particia Nearing, [place] 03/23/21 1025

## 2021-03-23 NOTE — ED Triage Notes (Signed)
Was working under a car on Saturday, dropped a wrench on forehead. Dull headache since, worsening into today. Dull pain behind eyes bilaterally. Reports nausea, light sensitivity, and dizziness. Denies loss of consciousness and vomiting. States ibuprofen has helped to relieve the pain.

## 2021-03-23 NOTE — Discharge Instructions (Addendum)
Have someone drive you to the emergency department for further evaluation as soon as possible

## 2021-03-26 ENCOUNTER — Telehealth: Payer: Self-pay | Admitting: Nurse Practitioner

## 2021-03-26 NOTE — Telephone Encounter (Signed)
Pt was called Vm was left for Ed FU

## 2021-05-05 ENCOUNTER — Ambulatory Visit: Payer: Commercial Managed Care - PPO | Admitting: Family

## 2021-09-09 ENCOUNTER — Ambulatory Visit: Admit: 2021-09-09 | Disposition: A | Discharge status: Home / Self Care | Payer: Commercial Managed Care - PPO

## 2023-03-24 IMAGING — CT CT HEAD W/O CM
4 series · 17 of 47 positions shown, 19 images · non-contrast
Comparison: None.

CLINICAL DATA: Head trauma.  Dizziness with nausea and photophobia.

EXAM:
CT HEAD WITHOUT CONTRAST
TECHNIQUE: Contiguous axial images were obtained from the base of the skull
through the vertex without intravenous contrast.

[Series 2: head wo · axial · 0.43mm/px · z∈[-114,-4]mm · 7 of 30 slices shown, 9 images]
[im 4/30  brain]
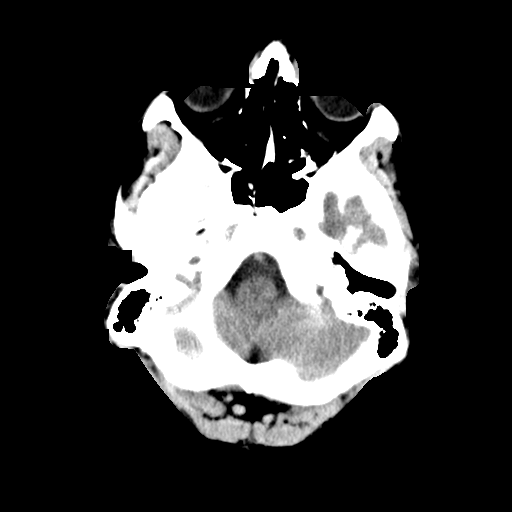
[im 4/30  bone]
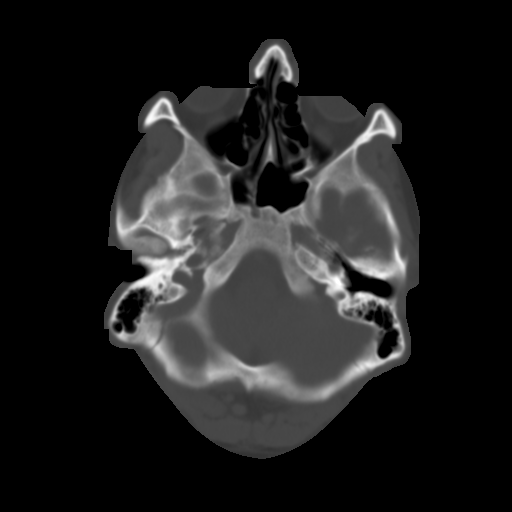
[im 8/30  brain]
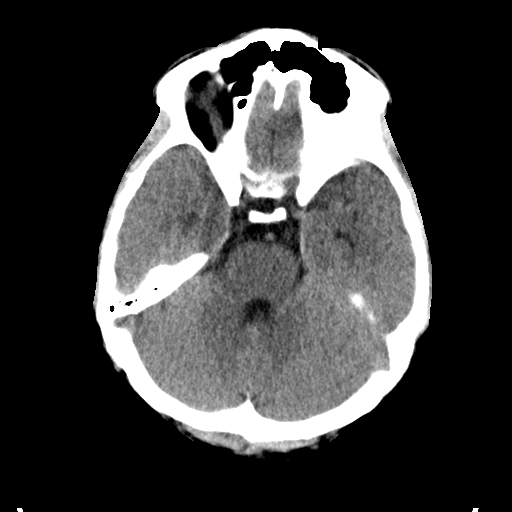
[im 11/30  brain]
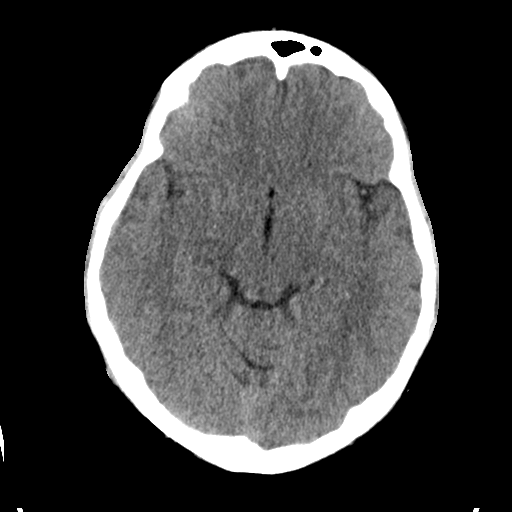
[im 15/30  brain]
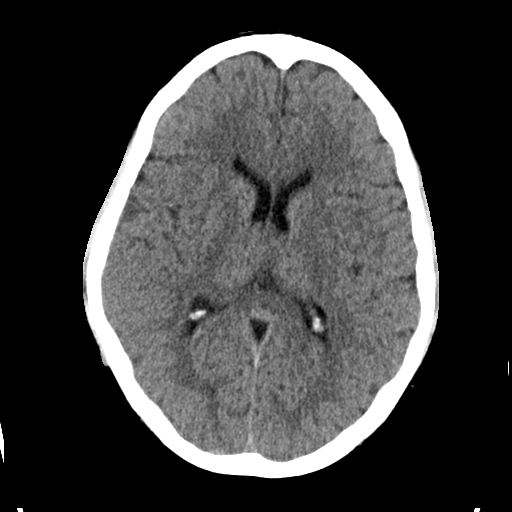
[im 19/30  brain]
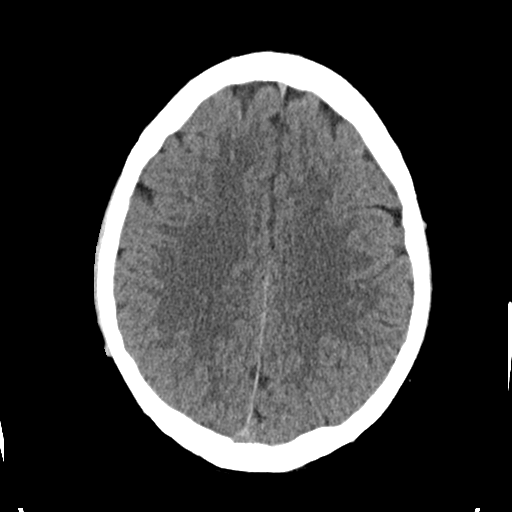
[im 19/30  bone]
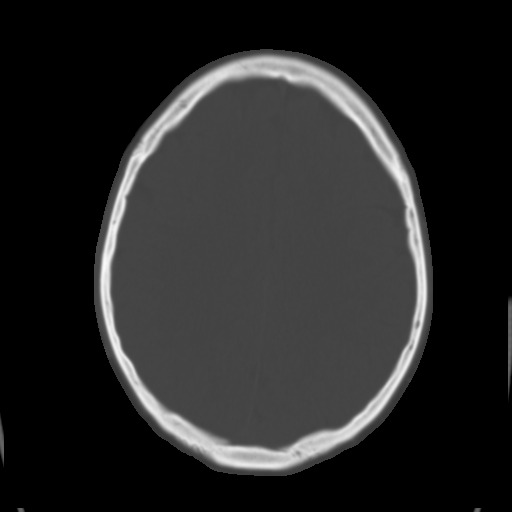
[im 22/30  brain]
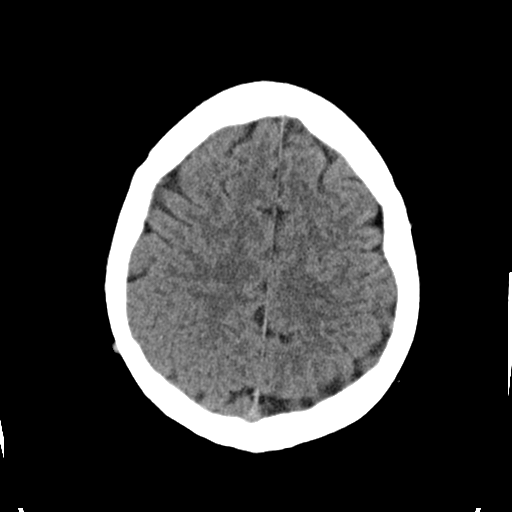
[im 26/30  brain]
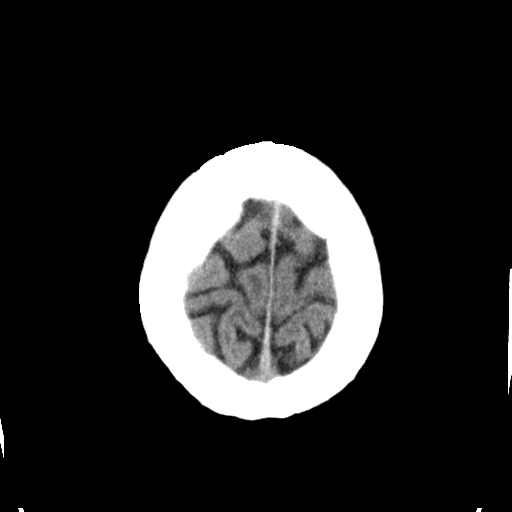

[Series 3: head bone · axial · 0.43mm/px · z∈[-115,-63]mm · 4 of 75 slices shown]
[im 8/75  bone]
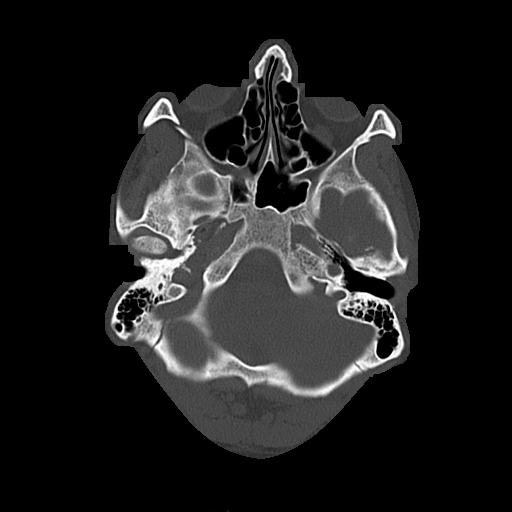
[im 15/75  bone]
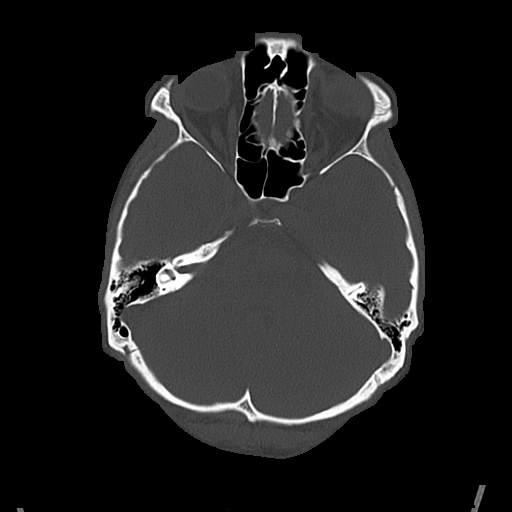
[im 23/75  bone]
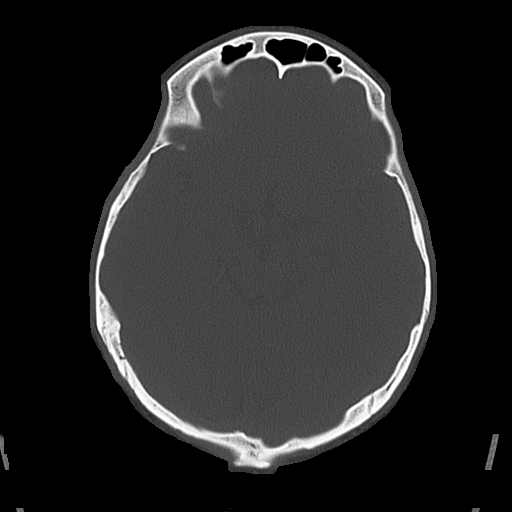
[im 34/75  bone]
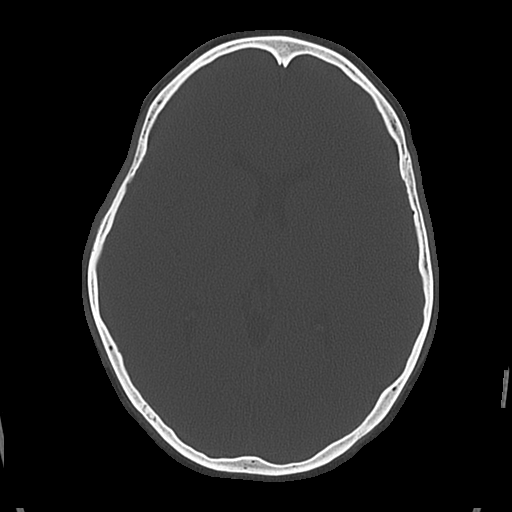

[Series 4: coronal soft · coronal · 0.33mm/px · 3 of 68 slices shown]
[im 23/68  brain]
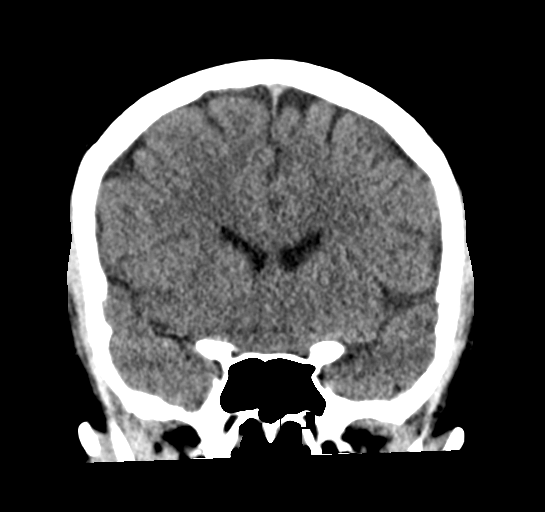
[im 30/68  brain]
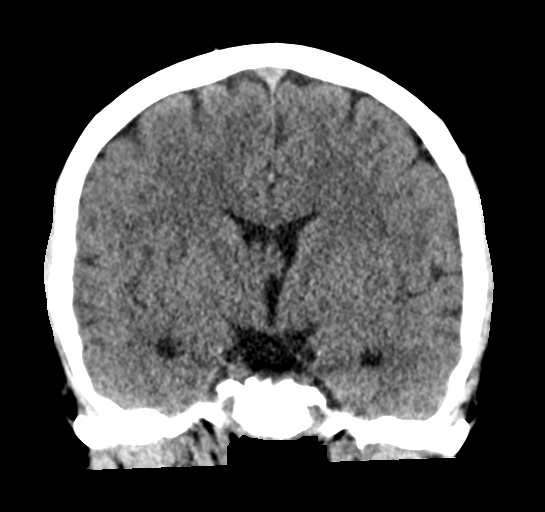
[im 38/68  brain]
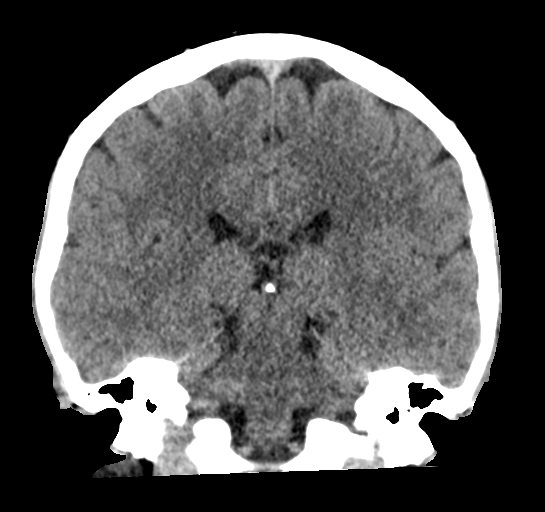

[Series 5: sagittal soft · sagittal · 0.34mm/px · 3 of 61 slices shown]
[im 21/61  brain]
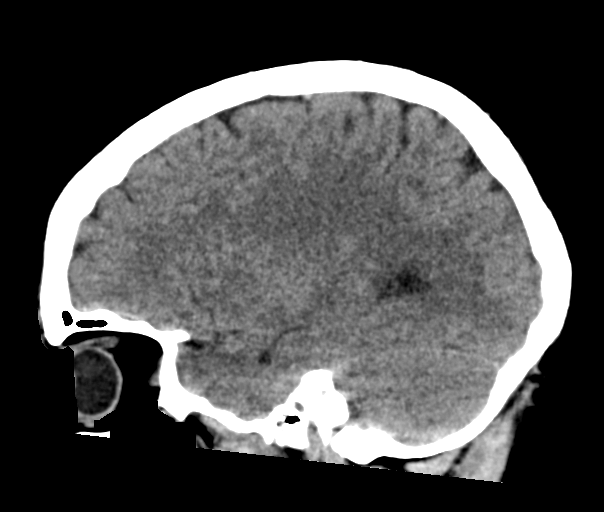
[im 31/61  brain]
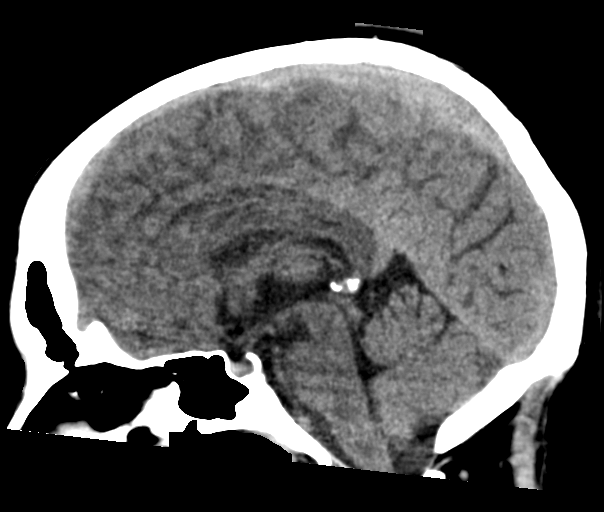
[im 41/61  brain]
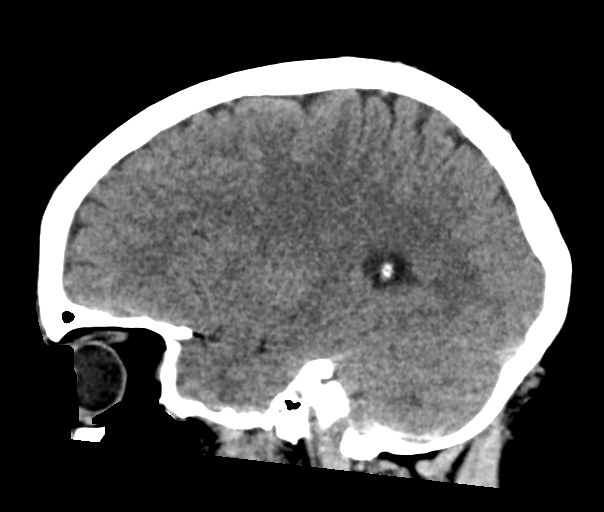

[17 of 47 positions shown; findings below may reference images not displayed]

FINDINGS: Brain: There is no evidence for acute hemorrhage, hydrocephalus,
mass lesion, or abnormal extra-axial fluid collection. No definite
CT evidence for acute infarction.

Vascular: No hyperdense vessel or unexpected calcification.

Skull: No evidence for fracture. No worrisome lytic or sclerotic
lesion.

Sinuses/Orbits: The visualized paranasal sinuses and mastoid air
cells are clear. Visualized portions of the globes and intraorbital
fat are unremarkable.

Other: None.
IMPRESSION: Unremarkable exam.  No acute intracranial abnormality.

## 2023-07-06 ENCOUNTER — Emergency Department (HOSPITAL_COMMUNITY): Payer: Managed Care, Other (non HMO)

## 2023-07-06 ENCOUNTER — Other Ambulatory Visit: Payer: Self-pay

## 2023-07-06 ENCOUNTER — Emergency Department (HOSPITAL_COMMUNITY)
Admission: EM | Admit: 2023-07-06 | Discharge: 2023-07-07 | Disposition: A | Discharge status: Home / Self Care | Payer: Managed Care, Other (non HMO) | Attending: Emergency Medicine | Admitting: Emergency Medicine

## 2023-07-06 DIAGNOSIS — W0110XA Fall on same level from slipping, tripping and stumbling with subsequent striking against unspecified object, initial encounter: Secondary | ICD-10-CM | POA: Insufficient documentation

## 2023-07-06 DIAGNOSIS — R519 Headache, unspecified: Secondary | ICD-10-CM | POA: Diagnosis present

## 2023-07-06 DIAGNOSIS — Y93K1 Activity, walking an animal: Secondary | ICD-10-CM | POA: Diagnosis not present

## 2023-07-06 DIAGNOSIS — W19XXXA Unspecified fall, initial encounter: Secondary | ICD-10-CM

## 2023-07-06 DIAGNOSIS — M542 Cervicalgia: Secondary | ICD-10-CM | POA: Insufficient documentation

## 2023-07-06 DIAGNOSIS — R202 Paresthesia of skin: Secondary | ICD-10-CM | POA: Diagnosis not present

## 2023-07-06 LAB — CBC WITH DIFFERENTIAL/PLATELET
Abs Immature Granulocytes: 0.04 10*3/uL (ref 0.00–0.07)
Basophils Absolute: 0.1 10*3/uL (ref 0.0–0.1)
Basophils Relative: 1 %
Eosinophils Absolute: 0.3 10*3/uL (ref 0.0–0.5)
Eosinophils Relative: 2 %
HCT: 37.1 % — ABNORMAL LOW (ref 39.0–52.0)
Hemoglobin: 12.8 g/dL — ABNORMAL LOW (ref 13.0–17.0)
Immature Granulocytes: 0 %
Lymphocytes Relative: 29 %
Lymphs Abs: 3.1 10*3/uL (ref 0.7–4.0)
MCH: 31.4 pg (ref 26.0–34.0)
MCHC: 34.5 g/dL (ref 30.0–36.0)
MCV: 90.9 fL (ref 80.0–100.0)
Monocytes Absolute: 0.9 10*3/uL (ref 0.1–1.0)
Monocytes Relative: 8 %
Neutro Abs: 6.5 10*3/uL (ref 1.7–7.7)
Neutrophils Relative %: 60 %
Platelets: 309 10*3/uL (ref 150–400)
RBC: 4.08 MIL/uL — ABNORMAL LOW (ref 4.22–5.81)
RDW: 12.3 % (ref 11.5–15.5)
WBC: 10.9 10*3/uL — ABNORMAL HIGH (ref 4.0–10.5)
nRBC: 0 % (ref 0.0–0.2)

## 2023-07-06 LAB — BASIC METABOLIC PANEL
Anion gap: 7 (ref 5–15)
BUN: 12 mg/dL (ref 6–20)
CO2: 25 mmol/L (ref 22–32)
Calcium: 9.1 mg/dL (ref 8.9–10.3)
Chloride: 103 mmol/L (ref 98–111)
Creatinine, Ser: 0.69 mg/dL (ref 0.61–1.24)
GFR, Estimated: >60 mL/min (ref >60)
Glucose, Bld: 112 mg/dL — ABNORMAL HIGH (ref 70–99)
Potassium: 3.3 mmol/L — ABNORMAL LOW (ref 3.5–5.1)
Sodium: 135 mmol/L (ref 135–145)

## 2023-07-06 MED ORDER — IBUPROFEN 200 MG PO TABS
600.0000 mg | ORAL_TABLET | Freq: Once | ORAL | Status: AC
  Administered 2023-07-06: 600 mg via ORAL
  Filled 2023-07-06: qty 3

## 2023-07-06 MED ORDER — ONDANSETRON 4 MG PO TBDP
4.0000 mg | ORAL_TABLET | Freq: Once | ORAL | Status: AC
  Administered 2023-07-06: 4 mg via ORAL
  Filled 2023-07-06: qty 1

## 2023-07-06 MED ORDER — GADOBUTROL 1 MMOL/ML IV SOLN
7.0000 mL | Freq: Once | INTRAVENOUS | Status: AC | PRN
  Administered 2023-07-06: 7 mL via INTRAVENOUS

## 2023-07-06 MED ORDER — DIPHENHYDRAMINE HCL 25 MG PO CAPS
25.0000 mg | ORAL_CAPSULE | Freq: Once | ORAL | Status: AC
  Administered 2023-07-06: 25 mg via ORAL
  Filled 2023-07-06: qty 1

## 2023-07-06 NOTE — ED Provider Notes (Signed)
Patient accepted at shift handoff with history significant for anxiety, migraines, neurocardiogenic syncope presented to the emergency department after a mechanical fall which occurred 2 days prior.  Patient reported hitting the posterior part of his head with no loss of consciousness.  He had a posterior headache with nausea at the time.  He then developed paresthesias to all 4 extremities.  At time of arrival had paresthesias to left upper and lower extremity and complains of subjective slight weakness on the left side when compared to the right.  Plan to check on results of CT scans and MRI.  If no acute findings we will plan to refer to primary care to check for possible vitamin deficiencies or other causes of patient's paresthesias. Physical Exam  BP 119/84 (BP Location: Left Arm)   Pulse 88   Temp 98.2 F (36.8 C) (Oral)   Resp 16   Ht 5\' 8"  (1.727 m)   Wt 68 kg   SpO2 100%   BMI 22.81 kg/m   Physical Exam  Procedures  Procedures  ED Course / MDM    Medical Decision Making Amount and/or Complexity of Data Reviewed Labs: ordered. Radiology: ordered.  Risk OTC drugs. Prescription drug management.   Unremarkable head CT. Cervical spine CT shows 1. No acute fracture or subluxation of the cervical spine.  2. Mild broad-based reversal of normal lordosis may be positional or  due to muscle spasm.  Lumbar CT shows: Normal alignment.  No acute displaced fractures identified.  MR cervical spine w/wo contrast shows: 1. No acute abnormality of the cervical spine.  2. Small central disc protrusion at C3-4 without spinal canal or  neural foraminal stenosis.    Unclear etiology of patient's symptoms but no emergent condition is apparent.  Plan to discharge patient home with recommendation for follow-up with a primary care provider for further evaluation.  Discharge home with return precautions.     Pamala Duffel 07/07/23 [id number]    Glendora Score, MD 07/07/23  [phone]

## 2023-07-06 NOTE — ED Triage Notes (Signed)
Patient states he was walking his dog and he fell backward hitting his head 2 days ago. Patient states he is having numbness in his left arm, left leg, and neck pain. Denies taking a blood thinner. Patient took ibuprofen for the neck pain. Patient has been walking since the fall. Patient ambulated to the room. Patient is able to move the left arm and hand and he is able to hold things, but he feels tingling int he hand when moving. He also reports brain fog.

## 2023-07-06 NOTE — ED Provider Notes (Signed)
[place] [facility]MENT AT [facility] Provider Note   CSN: [id number] Arrival date & time: 07/06/23  1932     History  Chief Complaint  Patient presents with   Neck Pain    Austin Vargas is a 32 y.o. adult with a past medical history significant for anxiety, history of migraines, and history of neurocardiogenic syncope who presents to the ED after a mechanical fall 2 days ago.  Patient was walking his dog who pulled him causing patient to fall backwards and hit the posterior aspect of his head.  No LOC.  Not on any blood thinners.  Patient states after the fall he had a posterior headache and some nausea.  He notes he then developed pins and needle sensation to all 4 extremities.  He admits to numbness to left upper and lower extremity.  Admits to feeling slightly weaker on the left side compared to the right.  No visual or speech changes.  Denies paresthesias prior to fall.  Also admits to some "brain fog".  Denies saddle anesthesia and bowel/bladder incontinence.  History obtained from patient and past medical records. No interpreter used during encounter.       Home Medications Prior to Admission medications   Medication Sig Start Date End Date Taking? Authorizing Provider  estradiol (ESTRACE) 0.5 MG tablet Take 0.5 tablets by mouth daily.    [provider]  ibuprofen (ADVIL) 600 MG tablet Take 1 tablet (600 mg total) by mouth every 6 (six) hours as needed. 12/01/20   Bing Neighbors, NP  ondansetron (ZOFRAN ODT) 4 MG disintegrating tablet Take 1 tablet (4 mg total) by mouth every 8 (eight) hours as needed for nausea or vomiting. 03/23/21   Pricilla Loveless, MD  polyethylene glycol powder (GLYCOLAX/MIRALAX) 17 GM/SCOOP powder Take 17 g by mouth 2 (two) times daily as needed. 02/02/21   Arvilla Market, MD  progesterone (PROMETRIUM) 100 MG capsule Take 100 mg by mouth daily.    [provider]      Allergies    Prednisone,  Hydrocodone-acetaminophen, Paxil [paroxetine hcl], and Vicodin [hydrocodone-acetaminophen]    Review of Systems   Review of Systems  Neurological:  Positive for weakness and numbness.    Physical Exam Updated Vital Signs BP (!) 128/93   Pulse 86   Temp 97.9 F (36.6 C) (Oral)   Resp 16   Ht 5\' 8"  (1.727 m)   Wt 68 kg   SpO2 100%   BMI 22.81 kg/m  Physical Exam Vitals and nursing note reviewed.  Constitutional:      General: Austin Vargas is not in acute distress.    Appearance: Austin Vargas is not ill-appearing.  HENT:     Head: Normocephalic.  Eyes:     Pupils: Pupils are equal, round, and reactive to light.  Neck:     Comments: No cervical midline tenderness. Reproducible tenderness to left side of neck Cardiovascular:     Rate and Rhythm: Normal rate and regular rhythm.     Pulses: Normal pulses.     Heart sounds: Normal heart sounds. No murmur heard.    No friction rub. No gallop.  Pulmonary:     Effort: Pulmonary effort is normal.     Breath sounds: Normal breath sounds.  Abdominal:     General: Abdomen is flat. There is no distension.     Palpations: Abdomen is soft.     Tenderness: There is no abdominal tenderness. There is no guarding or rebound.  Musculoskeletal:  General: Normal range of motion.     Cervical back: Neck supple.  Skin:    General: Skin is warm and dry.  Neurological:     General: No focal deficit present.     Mental Status: Austin Vargas is alert.     Comments: Decreased sensation to LUE and LLE. 5/5 strength of upper and lower extremities.  Psychiatric:        Mood and Affect: Mood normal.        Behavior: Behavior normal.     ED Results / Procedures / Treatments   Labs (all labs ordered are listed, but only abnormal results are displayed) Labs Reviewed  CBC WITH DIFFERENTIAL/PLATELET  BASIC METABOLIC PANEL    EKG None  Radiology No results found.  Procedures Procedures    Medications Ordered in ED Medications  ibuprofen  (ADVIL) tablet 600 mg (has no administration in time range)    ED Course/ Medical Decision Making/ A&P                                 Medical Decision Making Amount and/or Complexity of Data Reviewed Independent Historian: friend Labs: ordered. Decision-making details documented in ED Course. Radiology: ordered and independent interpretation performed. Decision-making details documented in ED Course.  Risk OTC drugs.   This patient presents to the ED for concern of neck pain, this involves an extensive number of treatment options, and is a complaint that carries with it a high risk of complications and morbidity.  The differential diagnosis includes bony fracture, central cord compression, vitamin deficiency, etc  ED after a mechanical fall that occurred 2 days ago.  Patient admits to paresthesias to all 4 extremities and numbness to left upper and lower extremities.  Also admits to some weakness on his left side.  No paresthesias prior to fall.  No LOC.  Patient not on any blood thinners.  Upon arrival patient afebrile, not tachycardic or hypoxic.  Patient in no acute distress.  Decreased sensation to the left.  5/5 strength of upper and lower extremities bilaterally.  No cervical midline tenderness.  Does have some mild lumbar midline tenderness.  CT scans ordered to rule out bony fractures and intracranial abnormalities.  MRI C-spine due to paresthesias and numbness.  Ibuprofen given. Discussed with Dr. Renaye Rakers who agrees with assessment and plan.   Patient handed off to [facility] pending CT and MRI. If unremarkable, patient may be discharged home.         Final Clinical Impression(s) / ED Diagnoses Final diagnoses:  Neck pain  Fall, initial encounter  Paresthesias    Rx / DC Orders ED Discharge Orders     None         Jesusita Oka 07/06/23 2211    Terald Sleeper, MD 07/06/23 2324

## 2023-07-07 NOTE — Discharge Instructions (Addendum)
Your workup tonight was reassuring.  Please follow-up with your primary care provider for further evaluation and management as needed.

## 2023-11-27 ENCOUNTER — Ambulatory Visit: Admission: EM | Admit: 2023-11-27 | Discharge: 2023-11-27 | Disposition: A | Discharge status: Home / Self Care

## 2023-11-27 DIAGNOSIS — J029 Acute pharyngitis, unspecified: Secondary | ICD-10-CM | POA: Diagnosis not present

## 2023-11-27 DIAGNOSIS — J028 Acute pharyngitis due to other specified organisms: Secondary | ICD-10-CM

## 2023-11-27 LAB — POCT RAPID STREP A (OFFICE): Rapid Strep A Screen: NEGATIVE

## 2023-11-27 MED ORDER — AMOXICILLIN 875 MG PO TABS: 875.0000 mg | ORAL_TABLET | Freq: Two times a day (BID) | ORAL | 0 refills | Status: AC

## 2023-11-27 NOTE — ED Provider Notes (Signed)
 [facility]    CSN: [id number] Arrival date & time: 11/27/23  1050      History   Chief Complaint Chief Complaint  Patient presents with   Sore Throat   Nasal Congestion    HPI OLIVER HEITZENRATER is a 33 y.o. adult.   33 year old who presents to urgent care with complaints of severely sore throat.  This started last night and has progressed to a severe burning in the throat.  It is difficult to swallow due to this.  They report that the throat feels like it is on fire.  They have had some Congestion as well as some fatigue.  They deny any fevers, cough, shortness of breath, chest pain, ear pain, body aches.  They believe that there has been someone at work with the flu.  They do relate that they have a fair amount of postnasal drip down the back of the throat which has had in the past right before develops an infection.   Sore Throat Pertinent negatives include no chest pain, no abdominal pain and no shortness of breath.    Past Medical History:  Diagnosis Date   Allergy    Anxiety    Bicuspid aortic valve    GERD (gastroesophageal reflux disease)    GERD (gastroesophageal reflux disease)    Heart murmur    Patent foramen ovale    Syncope -neurocardiogenic    He underwent extensive evaluation by [facility]. This included an ECG, Holter monitor, echocardiogram, cranial CT scan, and tilt table testing. His echocardiogram was remarkable for a PFO by bubble study. He also appeared to have a bicuspid aortic valve but had no significant gradient.  Holter neg for significant arrhythmias. 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 Authorizing Provider  estradiol (ESTRACE) 2 MG tablet Take 2 mg by mouth 2 (two) times daily. 02/14/20  Yes [provider]  progesterone (PROMETRIUM) 100 MG capsule Take 100 mg by mouth daily. 03/30/20  Yes [provider]  progesterone (PROMETRIUM) 200 MG capsule Take 200 mg by mouth at bedtime. 11/18/23  Yes [provider]  spironolactone (ALDACTONE) 100 MG tablet Take 100 mg by mouth 2 (two) times daily. 03/30/20  Yes [provider]  spironolactone (ALDACTONE) 50 MG tablet Take 1 tablet by mouth 2 (two) times daily.   Yes [provider]  azelastine (ASTELIN) 0.1 % nasal spray Place 2 sprays into both nostrils 2 (two) times daily. 05/28/23   [provider]  chlorhexidine (PERIDEX) 0.12 % solution Use as directed in the mouth or  throat as directed.    [provider]  clindamycin (CLINDAGEL) 1 % gel Apply 1 Application topically 2 (two) times daily. 11/13/23 11/27/23  [provider]  clobetasol cream (TEMOVATE) 0.05 % Apply 1 Application topically 2 (two) times daily. 07/04/23   [provider]  estradiol (ESTRACE) 0.5 MG tablet Take 0.5 tablets by mouth daily.    [provider]  ibuprofen (ADVIL) 600 MG tablet Take 1 tablet (600 mg total) by mouth every 6 (six) hours as needed. 12/01/20   Bing Neighbors, NP   ibuprofen (ADVIL) 800 MG tablet Take 800 mg by mouth every 8 (eight) hours as needed. 11/20/23 11/27/23  [provider]  metroNIDAZOLE (METROGEL) 1 % gel Apply 1 Application topically daily. 08/01/17   [provider]  montelukast (SINGULAIR) 10 MG tablet Take 1 tablet by mouth daily. 10/20/21   [provider]  ondansetron (ZOFRAN ODT) 4 MG disintegrating tablet Take 1 tablet (4 mg total) by mouth every 8 (eight) hours as needed for nausea or vomiting. 03/23/21   Pricilla Loveless, MD  polyethylene glycol powder (GLYCOLAX/MIRALAX) 17 GM/SCOOP powder Take 17 g by mouth 2 (two) times daily as needed. 02/02/21   Arvilla Market, MD  progesterone (PROMETRIUM) 100 MG capsule Take 100 mg by mouth daily.    [provider]    Family History Family History  Problem Relation Age of Onset   Heart disease Maternal Grandmother    Breast cancer Other        maternal great grandmother   Diabetes Paternal Grandmother    Heart disease Other        maternal great grandmother   Colon cancer Neg Hx    Throat cancer Neg Hx    Esophageal cancer Neg Hx    Rectal cancer Neg Hx    Stomach cancer Neg Hx     Social History Social History   Tobacco Use   Smoking status: Never   Smokeless tobacco: Never  Vaping Use   Vaping status: Never Used  Substance Use Topics   Alcohol use: Yes    Alcohol/week: 1.0 standard drink of alcohol    Types: 1 Shots of liquor per week    Comment: Social   Drug use: No     Allergies   Hydrocodone-acetaminophen, Paroxetine hcl, Prednisone, and Paroxetine   Review of Systems Review of Systems  Constitutional:  Positive for fatigue. Negative for chills and fever.  HENT:  Positive for congestion and sore throat. Negative for ear pain.   Eyes:  Negative for pain and visual disturbance.  Respiratory:  Negative for cough and shortness of breath.   Cardiovascular:  Negative for chest pain and palpitations.  Gastrointestinal:   Negative for abdominal pain and vomiting.  Genitourinary:  Negative for dysuria and hematuria.  Musculoskeletal:  Negative for arthralgias and back pain.  Skin:  Negative for color change and rash.  Neurological:  Negative for seizures and syncope.  All other systems reviewed and are negative.    Physical Exam Triage Vital Signs ED Triage Vitals  Encounter Vitals Group     BP 11/27/23 1103 100/68     Systolic BP Percentile --      Diastolic BP Percentile --      Pulse Rate 11/27/23 1103 82     Resp 11/27/23 1103 16     Temp 11/27/23 1103 98.2 F (36.8 C)     Temp Source 11/27/23 1103 Oral     SpO2 11/27/23 1103  98 %     Weight 11/27/23 1058 140 lb (63.5 kg)     Height 11/27/23 1058 5\' 8"  (1.727 m)     Head Circumference --      Peak Flow --      Pain Score 11/27/23 1056 9     Pain Loc --      Pain Education --      Exclude from Growth Chart --    No data found.  Updated Vital Signs BP 100/68 (BP Location: Right Arm)   Pulse 82   Temp 98.2 F (36.8 C) (Oral)   Resp 16   Ht 5\' 8"  (1.727 m)   Wt 140 lb (63.5 kg)   SpO2 98%   BMI 21.29 kg/m   Visual Acuity Right Eye Distance:   Left Eye Distance:   Bilateral Distance:    Right Eye Near:   Left Eye Near:    Bilateral Near:     Physical Exam Vitals and nursing note reviewed.  Constitutional:      General: Savas is not in acute distress.    Appearance: Yeiren is well-developed.  HENT:     Head: Normocephalic and atraumatic.     Right Ear: Tympanic membrane normal.     Left Ear: Tympanic membrane normal.     Mouth/Throat:     Mouth: Mucous membranes are moist.     Pharynx: Pharyngeal swelling and posterior oropharyngeal erythema present.  Eyes:     Conjunctiva/sclera: Conjunctivae normal.  Cardiovascular:     Rate and Rhythm: Normal rate and regular rhythm.     Heart sounds: No murmur heard. Pulmonary:     Effort: Pulmonary effort is normal. No respiratory distress.     Breath sounds: Normal breath  sounds.  Abdominal:     Palpations: Abdomen is soft.     Tenderness: There is no abdominal tenderness.  Musculoskeletal:        General: No swelling.     Cervical back: Neck supple.  Skin:    General: Skin is warm and dry.     Capillary Refill: Capillary refill takes less than 2 seconds.  Neurological:     Mental Status: Carey is alert.  Psychiatric:        Mood and Affect: Mood normal.      UC Treatments / Results  Labs (all labs ordered are listed, but only abnormal results are displayed) Labs Reviewed  POCT RAPID STREP A (OFFICE)    EKG   Radiology No results found.  Procedures Procedures (including critical care time)  Medications Ordered in UC Medications - No data to display  Initial Impression / Assessment and Plan / UC Course  I have reviewed the triage vital signs and the nursing notes.  Pertinent labs & imaging results that were available during my care of the patient were reviewed by me and considered in my medical decision making (see chart for details).     Sore throat  Acute pharyngitis due to other specified organisms   Strep test is negative.  Symptoms are most consistent with an acute pharyngitis.  Given the severity of the symptoms we will treat this with the following: Amoxicillin 875 mg twice daily for 7 days.  This is an antibiotic.  Take this with food. Alternate Tylenol and ibuprofen for fevers, body aches May use over-the-counter lozenges for sore throat Rest and stay hydrated.   Return to urgent care or PCP if symptoms worsen or fail to resolve.    Final  Clinical Impressions(s) / UC Diagnoses   Final diagnoses:  None   Discharge Instructions   None    ED Prescriptions   None    PDMP not reviewed this encounter.   Landis Martins, [place] 11/27/23 1136

## 2023-11-27 NOTE — ED Triage Notes (Signed)
'  I started with a tickle in my throat yesterday then became more sore, continuing through the night but woke up this morning with my  throat on fire". No fever known. Some fatigue and "congestion in nose". No new/unexplained rash.

## 2023-11-27 NOTE — Discharge Instructions (Addendum)
 Strep test is negative.  Symptoms are most consistent with an acute pharyngitis.  Given the severity of the symptoms we will treat this with the following: Amoxicillin 875 mg twice daily for 7 days.  This is an antibiotic.  Take this with food. Alternate Tylenol and ibuprofen for fevers, body aches May use over-the-counter lozenges for sore throat Rest and stay hydrated.   Return to urgent care or PCP if symptoms worsen or fail to resolve.

## 2023-11-28 ENCOUNTER — Ambulatory Visit: Payer: Self-pay

## 2024-02-28 NOTE — Progress Notes (Signed)
 [address] - AMBULATORY [facility]  - [place]RGENT [place] [address] [place] [id number]  History of Present Illness   History given by patient  Subjective Austin Vargas is a 33 y.o. male who presents for Cough (On and off x1 week. No fever), Chest Pain (Chest pain x1 week with exertion. Today he gets it with rest as well. Radiates to back), Nasal Congestion (X1 week. Also has metallic taste in his mouth), Headache (X1 week. Worse today), and Dizziness History of Present Illness This is a 33 year old male with a history of anxiety and ADHD presenting with cough, chest tightness, and eustachian tube dysfunction.   Approximately 2 weeks ago, he began experiencing chest congestion, accompanied by a mild cough and runny nose. The cough has since worsened, with a particularly bad episode today that was associated with chest pain and a sensation akin to needing to burp. He also reported a metallic taste during coughing episodes. His cough is non-productive. He has not been tested for any illnesses recently. He maintains good hydration, consuming at least 60 ounces of water daily while at work. He reports no difficulty breathing or need for a breathing treatment. He reports no gastrointestinal symptoms, body aches, or fever. He has no history of asthma. He reports no throat tightness, rash, or exposure to allergens. He has been feeling unusually sleepy. He has no cardiac history. He has attempted self-treatment with Zyrtec and mucus relief, which provided minimal relief. He has been on Zyrtec for 2 to 3 days without noticeable improvement. He has previously experienced palpitations with steroid use.  He experiences chest tightness and wheezing, particularly when laughing, but does not typically struggle with breathing. He has a history of panic attacks.  He has a history of anxiety but is not currently on any medication for it. He does not report any  suicidal or homicidal ideation. He is currently seeing a therapist but has not been on any medication for some time. He has a history of ADHD, which was initially misdiagnosed as bipolar disorder. He has previously been on Adderall and lithium.  He has been experiencing headaches, which he attributes to the coughing.  SOCIAL HISTORY He does not smoke but admits to occasional vaping.  Patient has no co-morbidities  or medications that might increase the risk for developing a severe infection   Medical History[1]  Review of Systems   Review of Systems   Negative except per HPI noted above.   Physicial Exam   Physical Exam Vitals and nursing note reviewed.  Constitutional:      General: He is not in acute distress.    Appearance: Normal appearance. He is not ill-appearing, toxic-appearing or diaphoretic.  HENT:     Head: Normocephalic and atraumatic.   Eyes:     Extraocular Movements: Extraocular movements intact.    Cardiovascular:     Rate and Rhythm: Normal rate and regular rhythm.     Pulses: Normal pulses.     Heart sounds: Normal heart sounds.  Pulmonary:     Effort: Pulmonary effort is normal.     Breath sounds: Normal breath sounds.   Musculoskeletal:        General: Normal range of motion.     Cervical back: Normal range of motion and neck supple.   Skin:    General: Skin is warm and dry.     Capillary Refill: Capillary refill takes less than 2 seconds.   Neurological:  General: No focal deficit present.     Mental Status: He is alert and oriented to person, place, and time.     Gait: Gait is intact.   Psychiatric:        Mood and Affect: Mood normal.        Behavior: Behavior is cooperative.     Objective Blood pressure 124/89, pulse 84, temperature 98.2 F (36.8 C), temperature source Tympanic, resp. rate 18, height 1.727 m (5' 8), weight 63.5 kg (140 lb), SpO2 97%. Physical Exam ENT: Fluid is present behind the tympanic membrane in both ears.  Mild nasal congestion is observed. The pharynx appears normal with no exudates, swelling, or erythema. Neck: Mild tenderness is noted in the cervical lymph nodes. Cardiovascular: Heart exhibits a normal rate and rhythm. Respiratory: Anterior and posterior lungs are clear to auscultation.  Labs   Recent Results (from the past 48 hours)  POC Influenza A&B NAT (IDNOW)   Collection Time: 02/28/24  1:58 PM  Result Value Ref Range   Influenza A RNA Negative Negative   Influenza B RNA Negative Negative  POC SARS-COV-2 SYMPTOMATIC (IDNOW)   Collection Time: 02/28/24  1:58 PM  Result Value Ref Range   SARS-COV-2 IDNOW Negative Negative   SARS IDNOW Information      A rapid, molecular diagnostic test on the IDNOW.  Negative results should be treated as presumptive and, if inconsistent with clinical symptoms should be confirmed with an alternative molecular assay.     Results Testing EKG shows normal sinus rhythm. COVID and FLU test results normal.    Diagnosis  Medical Decision Making Austin Vargas is a 33 y.o. male who presents to urgent care today with complaints of  Chief Complaint  Patient presents with  . Cough    On and off x1 week. No fever  . Chest Pain    Chest pain x1 week with exertion. Today he gets it with rest as well. Radiates to back  . Nasal Congestion    X1 week. Also has metallic taste in his mouth  . Headache    X1 week. Worse today  . Dizziness    1. Chest pain at rest (Primary) - ECG 12 lead  2. Acute cough - albuterol HFA (PROVENTIL HFA;VENTOLIN HFA;PROAIR HFA) 90 mcg/actuation inhaler; Inhale 2 puffs every 6 (six) hours as needed for wheezing.  Dispense: 1 each; Refill: 0 - POC Influenza A&B NAT (IDNOW) - POC SARS-COV-2 SYMPTOMATIC (IDNOW) - benzonatate (TESSALON) 100 mg capsule; Take 1 capsule (100 mg total) by mouth 3 (three) times a day as needed for cough.  Dispense: 14 capsule; Refill: 0   On exam, VS stable and patient is afebrile and in no  acute distress. Appears well-hydrated and capillary refill <2 seconds.  Assessment & Plan Initial Assessment: 33 year old male with chest congestion, cough, chest pain, metallic taste, headache, and chest tightness. No fever, body aches, or GI symptoms. No history of asthma or significant cardiac issues. Fluid behind tympanic membranes noted. Appears mildly anxious and would like to speak to a therapist.   Differential Diagnosis: - Viral infection: Suspected due to symptoms. Supportive care recommended. - Allergies: Possible due to mild congestion and postnasal drainage. Continue Zyrtec for at least a month. - Asthma: Undiagnosed asthma considered. Spirometry testing suggested with PCP. Albuterol prescribed. Pt declined prednisone  given AE. Advised discontinuing vaping.  - Panic attacks: EKG normal. Follow up with primary care provider for referral to psychiatry, as pt declined referral today. Recommended [facility].  Denies SI/HI.  - Eustachian tube dysfunction: Fluid behind tympanic membranes. Nasal saline, Flonase, auto insufflation exercises and Zyrtec recommended.  ED Course: 02/28/2024: Vitals checked. Heart rate initially 102, later 82 and 79. 02/28/2024: Physical exam performed. Anterior and posterior lungs clear. Fluid behind tympanic membranes noted. No facial sinus pain. Pharynx normal. No skin rash. Appears mildly anxious. No signs/symptoms of anaphylaxis.  02/28/2024: EKG performed. Normal sinus rhythm. 02/28/2024: COVID and flu tests ordered. Results negative. Recommended supportive care.  02/28/2024: Prescriptions for albuterol and benzonatate sent to pharmacy. Pt declined prednisone . May use OTC Flonase.   Final Assessment: Chest congestion, cough, chest pain, metallic taste, headache, and chest tightness. No fever, body aches, or GI symptoms. Fluid behind tympanic membranes noted. EKG normal. COVID and flu tests negative. Appears  mildly anxious.    Clinical Impression: - Viral infection - Allergies - Asthma - Panic attacks - Eustachian tube dysfunction  Disposition: Discharge: Home. Return if symptoms worsen or persist beyond a month. Follow-Up: Primary care provider for asthma evaluation and anxiety management. ENT specialist if symptoms persist beyond a month. Behavioral health center for immediate consultation.   Patient Education: Supportive care for viral infection. Nasal saline, Flonase, Zyrtec, Mucinex D. Auto insufflation exercises. Rest, hydration, good nutrition. Avoid double dosing medications.   Discharge Information  Symptomatic management discussed.  Patient was given verbal and written instructions on symptoms that necessitate return to the UC/ED, and instructed to f/u w/ UC or PCP if not improving in expected timeframe.   2. Patient/parent has been instructed on RX/OTC medications, dosages, side effects, and possible interactions as associated with each diagnosis in my impression and plan  above.   3. Patient education (verbal/handout) given on diagnosis, pathophysiology, treatment of diagnosis, side effects of medication use for treatment, restrictions while taking medication, supportives measures such as staying hydrated.   4. Red Flags associated with diagnosis/es were reviewed and patient instructed on action plan if red flags develop. They have been instructed that if symptoms worsen or red flags develop they should return to Urgent Care, go to the nearest ED, or activate EMS/911.     5. Patient and/or parent/guardian (if applicable) agreed with plan and voiced understanding.  No barriers to adherence perceived by myself.  Portions of this note may have been dictated using Dragon dictation software/hardware and may contain grammatical or spelling errors.    If a new prescription was given today, then I discussed potential side effects, drug interactions, instructions for taking the  medication, and the consequences of not taking it.    F/u: Follow up closely with primary care provider (PCP) and other specialists for further care and routine care, but seek medical attention sooner if worsening/concerning signs or symptoms.  Urgent Follow Up with Specialist A portion of this chart was generated utilizing [employer]  Electronically signed by: Tabita Tavoc, PA-C 02/28/2024 2:00 PM        [1] Past Medical History: Diagnosis Date  . Allergy   . Headache

## 2024-06-23 ENCOUNTER — Emergency Department (HOSPITAL_COMMUNITY)
Admission: EM | Admit: 2024-06-23 | Discharge: 2024-06-23 | Disposition: A | Discharge status: Home / Self Care | Attending: Emergency Medicine | Admitting: Emergency Medicine

## 2024-06-23 ENCOUNTER — Encounter (HOSPITAL_COMMUNITY): Payer: Self-pay | Admitting: Emergency Medicine

## 2024-06-23 ENCOUNTER — Other Ambulatory Visit: Payer: Self-pay

## 2024-06-23 DIAGNOSIS — M549 Dorsalgia, unspecified: Secondary | ICD-10-CM | POA: Diagnosis not present

## 2024-06-23 DIAGNOSIS — R309 Painful micturition, unspecified: Secondary | ICD-10-CM | POA: Diagnosis not present

## 2024-06-23 DIAGNOSIS — R300 Dysuria: Secondary | ICD-10-CM | POA: Diagnosis present

## 2024-06-23 DIAGNOSIS — R350 Frequency of micturition: Secondary | ICD-10-CM | POA: Diagnosis not present

## 2024-06-23 LAB — WET PREP, GENITAL
Clue Cells Wet Prep HPF POC: NONE SEEN
Sperm: NONE SEEN
Trich, Wet Prep: NONE SEEN
WBC, Wet Prep HPF POC: <10 (ref <10)
Yeast Wet Prep HPF POC: NONE SEEN

## 2024-06-23 LAB — URINALYSIS, ROUTINE W REFLEX MICROSCOPIC
Bilirubin Urine: NEGATIVE
Glucose, UA: NEGATIVE mg/dL
Hgb urine dipstick: NEGATIVE
Ketones, ur: NEGATIVE mg/dL
Leukocytes,Ua: NEGATIVE
Nitrite: NEGATIVE
Protein, ur: NEGATIVE mg/dL
Specific Gravity, Urine: 1.01 (ref 1.005–1.030)
pH: 7 (ref 5.0–8.0)

## 2024-06-23 LAB — CBC WITH DIFFERENTIAL/PLATELET
Abs Immature Granulocytes: 0.03 K/uL (ref 0.00–0.07)
Basophils Absolute: 0.1 K/uL (ref 0.0–0.1)
Basophils Relative: 1 %
Eosinophils Absolute: 0.2 K/uL (ref 0.0–0.5)
Eosinophils Relative: 2 %
HCT: 39.1 % (ref 39.0–52.0)
Hemoglobin: 12.8 g/dL — ABNORMAL LOW (ref 13.0–17.0)
Immature Granulocytes: 0 %
Lymphocytes Relative: 31 %
Lymphs Abs: 2.9 K/uL (ref 0.7–4.0)
MCH: 30.3 pg (ref 26.0–34.0)
MCHC: 32.7 g/dL (ref 30.0–36.0)
MCV: 92.4 fL (ref 80.0–100.0)
Monocytes Absolute: 0.8 K/uL (ref 0.1–1.0)
Monocytes Relative: 8 %
Neutro Abs: 5.3 K/uL (ref 1.7–7.7)
Neutrophils Relative %: 58 %
Platelets: 309 K/uL (ref 150–400)
RBC: 4.23 MIL/uL (ref 4.22–5.81)
RDW: 12.2 % (ref 11.5–15.5)
WBC: 9.3 K/uL (ref 4.0–10.5)
nRBC: 0 % (ref 0.0–0.2)

## 2024-06-23 LAB — COMPREHENSIVE METABOLIC PANEL WITH GFR
ALT: 8 U/L (ref 0–44)
AST: 18 U/L (ref 15–41)
Albumin: 4.3 g/dL (ref 3.5–5.0)
Alkaline Phosphatase: 48 U/L (ref 38–126)
Anion gap: 9 (ref 5–15)
BUN: 9 mg/dL (ref 6–20)
CO2: 24 mmol/L (ref 22–32)
Calcium: 9.2 mg/dL (ref 8.9–10.3)
Chloride: 104 mmol/L (ref 98–111)
Creatinine, Ser: 0.58 mg/dL — ABNORMAL LOW (ref 0.61–1.24)
GFR, Estimated: >60 mL/min (ref >60)
Glucose, Bld: 87 mg/dL (ref 70–99)
Potassium: 3.6 mmol/L (ref 3.5–5.1)
Sodium: 138 mmol/L (ref 135–145)
Total Bilirubin: 0.8 mg/dL (ref 0.0–1.2)
Total Protein: 6.9 g/dL (ref 6.5–8.1)

## 2024-06-23 NOTE — ED Provider Notes (Signed)
 [place] EMERGENCY DEPARTMENT AT [facility] Provider Note   CSN: [id number] Arrival date & time: 06/23/24  1758     Patient presents with: Dysuria   Austin Vargas is a 33 y.o. adult.  {Add pertinent medical, surgical, social history, OB history to HPI:3154} 33 year old male with no past medical history presents to the ED with a chief complaint of dysuria along with urinary frequency this been ongoing for the past week.  Currently sexually active with a male partner.  He reports, he was treated about a week ago at urgent care with Keflex for a urinary tract infection.  Continues to have burning whenever he tries to urinate.  He has not had any discharge.  Not taking any other medication for improvement in symptoms.  The history is provided by the patient.  Dysuria       Prior to Admission medications   Medication Sig Start Date End Date Taking? Authorizing Provider  azelastine (ASTELIN) 0.1 % nasal spray Place 2 sprays into both nostrils 2 (two) times daily. 05/28/23   [provider]  chlorhexidine (PERIDEX) 0.12 % solution Use as directed in the mouth or throat as directed.    [provider]  clobetasol cream (TEMOVATE) 0.05 % Apply 1 Application topically 2 (two) times daily. 07/04/23   [provider]  estradiol (ESTRACE) 0.5 MG tablet Take 0.5 tablets by mouth daily.    [provider]  estradiol (ESTRACE) 2 MG tablet Take 2 mg by mouth 2 (two) times daily. 02/14/20   [provider]  ibuprofen  (ADVIL ) 600 MG tablet Take 1 tablet (600 mg total) by mouth every 6 (six) hours as needed. 12/01/20   Arloa Suzen RAMAN, NP  metroNIDAZOLE (METROGEL) 1 % gel Apply 1 Application topically daily. 08/01/17   [provider]  montelukast (SINGULAIR) 10 MG tablet Take 1 tablet by mouth daily. 10/20/21   [provider]  ondansetron  (ZOFRAN  ODT) 4 MG disintegrating tablet Take 1 tablet (4 mg total) by mouth every  8 (eight) hours as needed for nausea or vomiting. 03/23/21   Freddi Hamilton, MD  polyethylene glycol powder (GLYCOLAX /MIRALAX ) 17 GM/SCOOP powder Take 17 g by mouth 2 (two) times daily as needed. 02/02/21   Prentiss Dorothyann Maxwell, MD  progesterone (PROMETRIUM) 100 MG capsule Take 100 mg by mouth daily.    [provider]  progesterone (PROMETRIUM) 100 MG capsule Take 100 mg by mouth daily. 03/30/20   [provider]  progesterone (PROMETRIUM) 200 MG capsule Take 200 mg by mouth at bedtime. 11/18/23   [provider]  spironolactone (ALDACTONE) 100 MG tablet Take 100 mg by mouth 2 (two) times daily. 03/30/20   [provider]  spironolactone (ALDACTONE) 50 MG tablet Take 1 tablet by mouth 2 (two) times daily.    [provider]    Allergies: Hydrocodone-acetaminophen , Paroxetine hcl, Prednisone , and Paroxetine    Review of Systems  Constitutional:  Negative for fever.  Genitourinary:  Positive for dysuria.    Updated Vital Signs BP 124/89   Pulse 83   Temp 98.2 F (36.8 C) (Oral)   Resp 15   SpO2 100%   Physical Exam Vitals and nursing note reviewed. Exam conducted with a chaperone present.  Constitutional:      Appearance: Normal appearance.  HENT:     Head: Normocephalic and atraumatic.     Mouth/Throat:     Mouth: Mucous membranes are moist.  Cardiovascular:     Rate and Rhythm:  Normal rate.  Pulmonary:     Effort: Pulmonary effort is normal.  Abdominal:     General: Abdomen is flat.     Tenderness: There is right CVA tenderness.  Genitourinary:    Comments: No rash, no penile discharge, no testicular pain.  Brought in by [employer]. Musculoskeletal:     Cervical back: Normal range of motion and neck supple.  Skin:    General: Skin is warm and dry.  Neurological:     Mental Status: He is alert and oriented to person, place, and time.     (all labs ordered are listed, but only abnormal results are displayed) Labs Reviewed   URINALYSIS, ROUTINE W REFLEX MICROSCOPIC - Abnormal; Notable for the following components:      Result Value   Color, Urine STRAW (*)    All other components within normal limits  CBC WITH DIFFERENTIAL/PLATELET - Abnormal; Notable for the following components:   Hemoglobin 12.8 (*)    All other components within normal limits  COMPREHENSIVE METABOLIC PANEL WITH GFR - Abnormal; Notable for the following components:   Creatinine, Ser 0.58 (*)    All other components within normal limits  WET PREP, GENITAL  GC/CHLAMYDIA PROBE AMP ([place]) NOT AT [facility]    EKG: None  Radiology: No results found.  {Document cardiac monitor, telemetry assessment procedure when appropriate:32947} Procedures   Medications Ordered in the ED - No data to display    {Click here for ABCD2, HEART and other calculators REFRESH Note before signing:1}                              Medical Decision Making Amount and/or Complexity of Data Reviewed Labs: ordered.             Portions of this note were generated with Scientist, clinical (histocompatibility and immunogenetics). Dictation errors may occur despite best attempts at proofreading.   Final diagnoses:  Dysuria    ED Discharge Orders     None

## 2024-06-23 NOTE — Discharge Instructions (Addendum)
 Your laboratory results are within normal limits today.  Your urinalysis did not show any signs of infection.  Please follow-up with your primary care physician at your earliest convenience.

## 2024-06-23 NOTE — ED Triage Notes (Signed)
 Pt reports dysuria and frequent urination x 1 week. Pt reports he was prescribed an antibiotic 1 week ago with no relief.

## 2024-06-25 LAB — GC/CHLAMYDIA PROBE AMP (~~LOC~~) NOT AT ARMC
Chlamydia: NEGATIVE
Comment: NEGATIVE
Comment: NORMAL
Neisseria Gonorrhea: NEGATIVE

## 2024-06-29 ENCOUNTER — Other Ambulatory Visit: Payer: Self-pay | Admitting: Urology

## 2024-07-13 NOTE — Progress Notes (Incomplete Revision)
 COVID Vaccine received:  []  No []  Yes Date of any COVID positive Test in last 90 days:  PCP - Heron Fear PA-C Cardiologist -   Chest x-ray -  EKG -   Stress Test -  ECHO - 10/26/11 Epic Cardiac Cath -   Bowel Prep - []  No  []   Yes ______  Pacemaker / ICD device []  No []  Yes   Spinal Cord Stimulator:[]  No []  Yes       History of Sleep Apnea? []  No []  Yes   CPAP used?- []  No []  Yes    Does the patient monitor blood sugar?          []  No []  Yes  []  N/A  Patient has: []  NO Hx DM   []  Pre-DM                 []  DM1  []   DM2 Does patient have a [employer] or Dexacom? []  No []  Yes   Fasting Blood Sugar Ranges-  Checks Blood Sugar _____ times a day  GLP1 agonist / usual dose -  GLP1 instructions:  SGLT-2 inhibitors / usual dose -  SGLT-2 instructions:   Blood Thinner / Instructions: Aspirin Instructions:  Comments:   Activity level: Patient is able / unable to climb a flight of stairs without difficulty; []  No CP  []  No SOB, but would have ___   Patient can / can not perform ADLs without assistance.   Anesthesia review:   Patient denies shortness of breath, fever, cough and chest pain at PAT appointment.  Patient verbalized understanding and agreement to the Pre-Surgical Instructions that were given to them at this PAT appointment. Patient was also educated of the need to review these PAT instructions again prior to his/her surgery.I reviewed the appropriate phone numbers to call if they have any and questions or concerns.

## 2024-07-13 NOTE — Patient Instructions (Signed)
 SURGICAL WAITING ROOM VISITATION  Patients having surgery or a procedure may have no more than 2 support people in the waiting area - these visitors may rotate.    Children under the age of 49 must have an adult with them who is not the patient.  Visitors with respiratory illnesses are discouraged from visiting and should remain at home.  If the patient needs to stay at the hospital during part of their recovery, the visitor guidelines for inpatient rooms apply. Pre-op nurse will coordinate an appropriate time for 1 support person to accompany patient in pre-op.  This support person may not rotate.    Please refer to the [facility] website for the visitor guidelines for Inpatients (after your surgery is over and you are in a regular room).       Your procedure is scheduled on: 08/01/24   Report to [facility] Main Entrance    Report to admitting at 1:15 PM   Call this number if you have problems the morning of surgery [phone]   Do not eat food or drink liquids:After Midnight.but may have sips of water to take meds.      Oral Hygiene is also important to reduce your risk of infection.                                    Remember - BRUSH YOUR TEETH THE MORNING OF SURGERY WITH YOUR REGULAR TOOTHPASTE   Stop all vitamins and herbal supplements 7 days before surgery.   Take these medicines the morning of surgery with A SIP OF WATER: Avodart(dutasteride), Estradiol, progesterone   Bring CPAP mask and tubing day of surgery.                              You may not have any metal on your body including hair pins, jewelry, and body piercing             Do not wear make-up, lotions, powders, perfumes/cologne, or deodorant  Do not wear nail polish including gel and S&S, artificial/acrylic nails, or any other type of covering on natural nails including finger and toenails. If you have artificial nails, gel coating, etc. that needs to be removed by a nail salon please have  this removed prior to surgery or surgery may need to be canceled/ delayed if the surgeon/ anesthesia feels like they are unable to be safely monitored.   Do not shave  48 hours prior to surgery.               Men may shave face and neck.   Do not bring valuables to the hospital. [facility] IS NOT             RESPONSIBLE   FOR VALUABLES.   Contacts, glasses, dentures or bridgework may not be worn into surgery.    DO NOT BRING YOUR HOME MEDICATIONS TO THE HOSPITAL. PHARMACY WILL DISPENSE MEDICATIONS LISTED ON YOUR MEDICATION LIST TO YOU DURING YOUR ADMISSION IN THE HOSPITAL!    Patients discharged on the day of surgery will not be allowed to drive home.  Someone NEEDS to stay with you for the first 24 hours after anesthesia.   Special Instructions: Bring a copy of your healthcare power of attorney and living will documents the day of surgery if you haven't scanned them before.  Please read over the following fact sheets you were given: IF YOU HAVE QUESTIONS ABOUT YOUR PRE-OP INSTRUCTIONS PLEASE [phone] [place]   If you received a COVID test during your pre-op visit  it is requested that you wear a mask when out in public, stay away from anyone that may not be feeling well and notify your surgeon if you develop symptoms. If you test positive for Covid or have been in contact with anyone that has tested positive in the last 10 days please notify you surgeon.    [facility] - Preparing for Surgery Before surgery, you can play an important role.  Because skin is not sterile, your skin needs to be as free of germs as possible.  You can reduce the number of germs on your skin by washing with CHG (chlorahexidine gluconate) soap before surgery.  CHG is an antiseptic cleaner which kills germs and bonds with the skin to continue killing germs even after washing. Please DO NOT use if you have an allergy to CHG or antibacterial soaps.  If your skin becomes reddened/irritated stop  using the CHG and inform your nurse when you arrive at Short Stay. Do not shave (including legs and underarms) for at least 48 hours prior to the first CHG shower.  You may shave your face/neck.  Please follow these instructions carefully:  1.  Shower with CHG Soap the night before surgery ONLY (DO NOT USE THE SOAP THE MORNING OF SURGERY).  2.  If you choose to wash your hair, wash your hair first as usual with your normal  shampoo.  3.  After you shampoo, rinse your hair and body thoroughly to remove the shampoo.                             4.  Use CHG as you would any other liquid soap.  You can apply chg directly to the skin and wash.  Gently with a scrungie or clean washcloth.  5.  Apply the CHG Soap to your body ONLY FROM THE NECK DOWN.   Do   not use on face/ open                           Wound or open sores. Avoid contact with eyes, ears mouth and   genitals (private parts).                       Wash face,  Genitals (private parts) with your normal soap.             6.  Wash thoroughly, paying special attention to the area where your    surgery  will be performed.  7.  Thoroughly rinse your body with warm water from the neck down.  8.  DO NOT shower/wash with your normal soap after using and rinsing off the CHG Soap.                9.  Pat yourself dry with a clean towel.            10.  Wear clean pajamas.            11.  Place clean sheets on your bed the night of your first shower and do not  sleep with pets. Day of Surgery : Do not apply any CHG, lotions/deodorants the morning of surgery.  Please wear clean clothes  to the hospital/surgery center.  FAILURE TO FOLLOW THESE INSTRUCTIONS MAY RESULT IN THE CANCELLATION OF YOUR SURGERY  PATIENT SIGNATURE_________________________________  NURSE SIGNATURE__________________________________  ________________________________________________________________________

## 2024-07-13 NOTE — Progress Notes (Addendum)
 COVID Vaccine received:  []  No [x]  Yes Date of any COVID positive Test in last 90 days: no PCP - Heron Fear PA-C Cardiologist - Dr. Jeronimo In [facility] and EKG are in media tab.  Chest x-ray -  EKG -  05/21/2024- media tab Stress Test -  ECHO -05/21/24 Media tab Cardiac Cath -   Bowel Prep - [x]  No  []   Yes ______  Pacemaker / ICD device [x]  No []  Yes   Spinal Cord Stimulator:[x]  No []  Yes       History of Sleep Apnea? [x]  No []  Yes   CPAP used?- [x]  No []  Yes    Does the patient monitor blood sugar?          [x]  No []  Yes  []  N/A  Patient has: [x]  NO Hx DM   []  Pre-DM                 []  DM1  []   DM2 Does patient have a [employer] or Dexacom? []  No []  Yes   Fasting Blood Sugar Ranges-  Checks Blood Sugar _____ times a day  GLP1 agonist / usual dose - no GLP1 instructions:  SGLT-2 inhibitors / usual dose - no SGLT-2 instructions:   Blood Thinner / Instructions:no Aspirin Instructions:no  Comments:   Activity level: Patient is able  to climb a flight of stairs without difficulty; [x]  No CP  [x]  No SOB,    Patient can  perform ADLs without assistance.   Anesthesia review: PFO  Patient denies shortness of breath, fever, cough and chest pain at PAT appointment.  Patient verbalized understanding and agreement to the Pre-Surgical Instructions that were given to them at this PAT appointment. Patient was also educated of the need to review these PAT instructions again prior to his/her surgery.I reviewed the appropriate phone numbers to call if they have any and questions or concerns.

## 2024-07-16 ENCOUNTER — Encounter (HOSPITAL_COMMUNITY)
Admission: RE | Admit: 2024-07-16 | Discharge: 2024-07-16 | Disposition: A | Discharge status: Home / Self Care | Source: Ambulatory Visit | Attending: Urology | Admitting: Urology

## 2024-07-16 ENCOUNTER — Encounter (HOSPITAL_COMMUNITY): Payer: Self-pay

## 2024-07-16 ENCOUNTER — Other Ambulatory Visit: Payer: Self-pay

## 2024-07-16 VITALS — BP 122/83 | HR 85 | Temp 98.1°F | Resp 16 | Ht 68.0 in | Wt 145.0 lb

## 2024-07-16 DIAGNOSIS — Z01812 Encounter for preprocedural laboratory examination: Secondary | ICD-10-CM | POA: Diagnosis present

## 2024-07-16 DIAGNOSIS — Z01818 Encounter for other preprocedural examination: Secondary | ICD-10-CM

## 2024-07-16 HISTORY — DX: Depression, unspecified: F32.A

## 2024-07-16 HISTORY — DX: Attention-deficit hyperactivity disorder, unspecified type: F90.9

## 2024-07-16 LAB — CBC
HCT: 39.9 % (ref 39.0–52.0)
Hemoglobin: 13.2 g/dL (ref 13.0–17.0)
MCH: 29.9 pg (ref 26.0–34.0)
MCHC: 33.1 g/dL (ref 30.0–36.0)
MCV: 90.3 fL (ref 80.0–100.0)
Platelets: 392 K/uL (ref 150–400)
RBC: 4.42 MIL/uL (ref 4.22–5.81)
RDW: 11.9 % (ref 11.5–15.5)
WBC: 7.6 K/uL (ref 4.0–10.5)
nRBC: 0 % (ref 0.0–0.2)

## 2024-08-01 ENCOUNTER — Ambulatory Visit (HOSPITAL_COMMUNITY): Payer: Self-pay | Admitting: Physician Assistant

## 2024-08-01 ENCOUNTER — Encounter (HOSPITAL_COMMUNITY): Payer: Self-pay | Admitting: Urology

## 2024-08-01 ENCOUNTER — Ambulatory Visit (HOSPITAL_COMMUNITY): Admitting: Registered Nurse

## 2024-08-01 ENCOUNTER — Encounter (HOSPITAL_COMMUNITY)
Admission: RE | Disposition: A | Discharge status: Home / Self Care | Payer: Self-pay | Source: Home / Self Care | Attending: Urology

## 2024-08-01 ENCOUNTER — Ambulatory Visit (HOSPITAL_COMMUNITY)
Admission: RE | Admit: 2024-08-01 | Discharge: 2024-08-01 | Disposition: A | Discharge status: Home / Self Care | Attending: Urology | Admitting: Urology

## 2024-08-01 ENCOUNTER — Other Ambulatory Visit: Payer: Self-pay

## 2024-08-01 DIAGNOSIS — F640 Transsexualism: Secondary | ICD-10-CM | POA: Diagnosis not present

## 2024-08-01 DIAGNOSIS — Z79890 Hormone replacement therapy (postmenopausal): Secondary | ICD-10-CM | POA: Diagnosis not present

## 2024-08-01 DIAGNOSIS — R861 Abnormal level of hormones in specimens from male genital organs: Secondary | ICD-10-CM | POA: Diagnosis present

## 2024-08-01 HISTORY — PX: ORCHIECTOMY: SHX2116

## 2024-08-01 SURGERY — ORCHIECTOMY
Anesthesia: General | Site: Scrotum | Laterality: Bilateral

## 2024-08-01 MED ORDER — ACETAMINOPHEN 500 MG PO TABS: 1000.0000 mg | ORAL_TABLET | ORAL | 0 refills | Status: AC | PRN

## 2024-08-01 MED ORDER — CEFAZOLIN SODIUM-DEXTROSE 2-4 GM/100ML-% IV SOLN
2.0000 g | INTRAVENOUS | Status: AC
  Administered 2024-08-01: 2 g via INTRAVENOUS
  Filled 2024-08-01: qty 100

## 2024-08-01 MED ORDER — ONDANSETRON HCL 4 MG/2ML IJ SOLN
INTRAMUSCULAR | Status: DC | PRN
  Administered 2024-08-01: 4 mg via INTRAVENOUS

## 2024-08-01 MED ORDER — FENTANYL CITRATE (PF) 100 MCG/2ML IJ SOLN
INTRAMUSCULAR | Status: AC
  Filled 2024-08-01: qty 2

## 2024-08-01 MED ORDER — BUPIVACAINE HCL (PF) 0.25 % IJ SOLN
INTRAMUSCULAR | Status: AC
  Filled 2024-08-01: qty 30

## 2024-08-01 MED ORDER — HYDROMORPHONE HCL 1 MG/ML IJ SOLN: 0.2500 mg | INTRAMUSCULAR | Status: DC | PRN

## 2024-08-01 MED ORDER — MIDAZOLAM HCL (PF) 2 MG/2ML IJ SOLN: 0.5000 mg | Freq: Once | INTRAMUSCULAR | Status: DC | PRN

## 2024-08-01 MED ORDER — OXYCODONE HCL 5 MG/5ML PO SOLN: 5.0000 mg | Freq: Once | ORAL | Status: DC | PRN

## 2024-08-01 MED ORDER — ACETAMINOPHEN 10 MG/ML IV SOLN
1000.0000 mg | Freq: Once | INTRAVENOUS | Status: AC
  Administered 2024-08-01: 1000 mg via INTRAVENOUS
  Filled 2024-08-01: qty 100

## 2024-08-01 MED ORDER — MIDAZOLAM HCL (PF) 2 MG/2ML IJ SOLN
INTRAMUSCULAR | Status: DC | PRN
  Administered 2024-08-01: 2 mg via INTRAVENOUS

## 2024-08-01 MED ORDER — BUPIVACAINE HCL (PF) 0.25 % IJ SOLN
INTRAMUSCULAR | Status: DC | PRN
  Administered 2024-08-01: 20 mL

## 2024-08-01 MED ORDER — SENNOSIDES-DOCUSATE SODIUM 8.6-50 MG PO TABS: 1.0000 | ORAL_TABLET | Freq: Two times a day (BID) | ORAL | 0 refills | Status: AC

## 2024-08-01 MED ORDER — 0.9 % SODIUM CHLORIDE (POUR BTL) OPTIME
TOPICAL | Status: DC | PRN
  Administered 2024-08-01: 1000 mL

## 2024-08-01 MED ORDER — OXYCODONE HCL 5 MG PO TABS: 5.0000 mg | ORAL_TABLET | Freq: Once | ORAL | Status: DC | PRN

## 2024-08-01 MED ORDER — LACTATED RINGERS IV SOLN: INTRAVENOUS | Status: DC

## 2024-08-01 MED ORDER — ACETAMINOPHEN 500 MG PO TABS: 1000.0000 mg | ORAL_TABLET | Freq: Once | ORAL | Status: DC

## 2024-08-01 MED ORDER — EPHEDRINE 5 MG/ML INJ
INTRAVENOUS | Status: AC
  Filled 2024-08-01: qty 5

## 2024-08-01 MED ORDER — FENTANYL CITRATE (PF) 100 MCG/2ML IJ SOLN
INTRAMUSCULAR | Status: DC | PRN
  Administered 2024-08-01 (×2): 50 ug via INTRAVENOUS

## 2024-08-01 MED ORDER — TRAMADOL HCL 50 MG PO TABS: 50.0000 mg | ORAL_TABLET | Freq: Four times a day (QID) | ORAL | 0 refills | Status: AC | PRN

## 2024-08-01 MED ORDER — ORAL CARE MOUTH RINSE: 15.0000 mL | Freq: Once | OROMUCOSAL | Status: AC

## 2024-08-01 MED ORDER — CHLORHEXIDINE GLUCONATE 0.12 % MT SOLN
15.0000 mL | Freq: Once | OROMUCOSAL | Status: AC
  Administered 2024-08-01: 15 mL via OROMUCOSAL

## 2024-08-01 MED ORDER — MEPERIDINE HCL 25 MG/ML IJ SOLN: 6.2500 mg | INTRAMUSCULAR | Status: DC | PRN

## 2024-08-01 MED ORDER — ONDANSETRON HCL 4 MG/2ML IJ SOLN
INTRAMUSCULAR | Status: AC
  Filled 2024-08-01: qty 2

## 2024-08-01 MED ORDER — PROPOFOL 1000 MG/100ML IV EMUL
INTRAVENOUS | Status: AC
  Filled 2024-08-01: qty 100

## 2024-08-01 MED ORDER — EPHEDRINE SULFATE (PRESSORS) 25 MG/5ML IV SOSY
PREFILLED_SYRINGE | INTRAVENOUS | Status: DC | PRN
  Administered 2024-08-01: 10 mg via INTRAVENOUS

## 2024-08-01 MED ORDER — LIDOCAINE HCL (PF) 2 % IJ SOLN
INTRAMUSCULAR | Status: DC | PRN
Start: 2024-08-01 — End: 2024-08-01
  Administered 2024-08-01: 40 mg via INTRADERMAL

## 2024-08-01 MED ORDER — MIDAZOLAM HCL 2 MG/2ML IJ SOLN
INTRAMUSCULAR | Status: AC
  Filled 2024-08-01: qty 2

## 2024-08-01 MED ORDER — LIDOCAINE HCL (PF) 2 % IJ SOLN
INTRAMUSCULAR | Status: AC
  Filled 2024-08-01: qty 5

## 2024-08-01 MED ORDER — SULFAMETHOXAZOLE-TRIMETHOPRIM 800-160 MG PO TABS: 1.0000 | ORAL_TABLET | Freq: Every day | ORAL | 0 refills | Status: AC

## 2024-08-01 MED ORDER — PROPOFOL 500 MG/50ML IV EMUL
INTRAVENOUS | Status: DC | PRN
  Administered 2024-08-01: 200 mg via INTRAVENOUS
  Administered 2024-08-01: 50 ug/kg/min via INTRAVENOUS

## 2024-08-01 SURGICAL SUPPLY — 33 items
BAG COUNTER SPONGE SURGICOUNT (BAG) IMPLANT
BENZOIN TINCTURE PRP APPL 2/3 (GAUZE/BANDAGES/DRESSINGS) ×1 IMPLANT
BLADE HEX COATED 2.75 (ELECTRODE) IMPLANT
BLADE SURG 15 STRL LF DISP TIS (BLADE) ×1 IMPLANT
BNDG GAUZE DERMACEA FLUFF 4 (GAUZE/BANDAGES/DRESSINGS) ×1 IMPLANT
COVER SURGICAL LIGHT HANDLE (MISCELLANEOUS) ×1 IMPLANT
DERMABOND ADVANCED .7 DNX12 (GAUZE/BANDAGES/DRESSINGS) ×1 IMPLANT
DRAIN PENROSE 0.25X18 (DRAIN) IMPLANT
DRAIN PENROSE 0.5X18 (DRAIN) IMPLANT
DRAPE LAPAROTOMY T 98X78 PEDS (DRAPES) ×1 IMPLANT
DRAPE UTILITY XL STRL (DRAPES) ×1 IMPLANT
ELECT REM PT RETURN 15FT ADLT (MISCELLANEOUS) ×1 IMPLANT
GAUZE 4X4 16PLY ~~LOC~~+RFID DBL (SPONGE) ×1 IMPLANT
GAUZE SPONGE 4X4 12PLY STRL (GAUZE/BANDAGES/DRESSINGS) ×1 IMPLANT
GLOVE SURG LX STRL 7.5 STRW (GLOVE) ×1 IMPLANT
GOWN STRL REUS W/ TWL XL LVL3 (GOWN DISPOSABLE) ×1 IMPLANT
KIT BASIN OR (CUSTOM PROCEDURE TRAY) ×1 IMPLANT
KIT TURNOVER KIT A (KITS) ×1 IMPLANT
NDL HYPO 22X1.5 SAFETY MO (MISCELLANEOUS) IMPLANT
NEEDLE HYPO 22X1.5 SAFETY MO (MISCELLANEOUS) IMPLANT
NS IRRIG 1000ML POUR BTL (IV SOLUTION) IMPLANT
PACK BASIC VI WITH GOWN DISP (CUSTOM PROCEDURE TRAY) ×1 IMPLANT
PENCIL SMOKE EVACUATOR (MISCELLANEOUS) ×1 IMPLANT
SOL PREP POV-IOD 4OZ 10% (MISCELLANEOUS) ×1 IMPLANT
SUPPORTER AHLETIC TETRA LG (SOFTGOODS) ×1 IMPLANT
SUT ETHILON 3 0 PS 1 (SUTURE) IMPLANT
SUT MNCRL AB 4-0 PS2 18 (SUTURE) ×1 IMPLANT
SUT SILK 0 30XBRD TIE 6 (SUTURE) ×1 IMPLANT
SUT VIC AB 3-0 SH 27XBRD (SUTURE) ×1 IMPLANT
SYR 20ML LL LF (SYRINGE) ×1 IMPLANT
SYR CONTROL 10ML LL (SYRINGE) ×1 IMPLANT
WATER STERILE IRR 1000ML POUR (IV SOLUTION) IMPLANT
YANKAUER SUCT BULB TIP 10FT TU (MISCELLANEOUS) ×1 IMPLANT

## 2024-08-01 NOTE — Anesthesia Preprocedure Evaluation (Addendum)
 Anesthesia Evaluation  Patient identified by MRN, date of birth, ID band Patient awake    Reviewed: Allergy & Precautions, NPO status , Patient's Chart, lab work & pertinent test results  History of Anesthesia Complications Negative for: history of anesthetic complications  Airway Mallampati: II  TM Distance: >3 FB Neck ROM: Full    Dental  (+) Dental Advisory Given   Pulmonary neg pulmonary ROS   breath sounds clear to auscultation       Cardiovascular (-) angina  Rhythm:Regular Rate:Normal  '13 ECHO: normal LVF, normal RVF, bubble test positive for PFO, probable bicuspid AV with normal flow and no aortic stenosis   Neuro/Psych  Headaches PSYCHIATRIC DISORDERS (ADHD) Anxiety Depression Bipolar Disorder   Neurocardiogenic syncope    GI/Hepatic Neg liver ROS,GERD  Controlled,,  Endo/Other  negative endocrine ROS    Renal/GU negative Renal ROS     Musculoskeletal   Abdominal   Peds  Hematology negative hematology ROS (+)   Anesthesia Other Findings   Reproductive/Obstetrics                              Anesthesia Physical Anesthesia Plan  ASA: 2  Anesthesia Plan: General   Post-op Pain Management: Tylenol  PO (pre-op)*   Induction: Intravenous  PONV Risk Score and Plan: 2 and Ondansetron  and Dexamethasone  Airway Management Planned: Oral ETT  Additional Equipment: None  Intra-op Plan:   Post-operative Plan: Extubation in OR  Informed Consent: I have reviewed the patients History and Physical, chart, labs and discussed the procedure including the risks, benefits and alternatives for the proposed anesthesia with the patient or authorized representative who has indicated his/her understanding and acceptance.     Dental advisory given  Plan Discussed with: CRNA and Surgeon  Anesthesia Plan Comments:          Anesthesia Quick Evaluation

## 2024-08-01 NOTE — Anesthesia Postprocedure Evaluation (Signed)
 Anesthesia Post Note  Patient: Austin Vargas  Procedure(s) Performed: ORCHIECTOMY (Bilateral: Scrotum)     Patient location during evaluation: PACU Anesthesia Type: General Level of consciousness: awake and alert, oriented and patient cooperative Pain management: pain level controlled Vital Signs Assessment: post-procedure vital signs reviewed and stable Respiratory status: spontaneous breathing, nonlabored ventilation and respiratory function stable Cardiovascular status: blood pressure returned to baseline and stable Postop Assessment: no apparent nausea or vomiting and adequate PO intake Anesthetic complications: no   No notable events documented.  Last Vitals:  Vitals:   08/01/24 1611 08/01/24 1615  BP: 107/68 114/76  Pulse: 69 85  Resp: 15 19  Temp: 36.9 C   SpO2: 100% 100%    Last Pain:  Vitals:   08/01/24 1615  TempSrc:   PainSc: 0-No pain                 Mayfield Schoene,E. Zaden Sako

## 2024-08-01 NOTE — Op Note (Unsigned)
 NAME: [name], [name] MEDICAL RECORD NO: [id number] ACCOUNT NO: [id number] DATE OF BIRTH: 10-24-1990 FACILITY: [facility] LOCATION: [facility] PHYSICIAN: Ricardo Likens, MD  Operative Report   SURGEON:  Ricardo Likens, MD.  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  PROCEDURE PERFORMED:  Bilateral simple orchiectomy.  ESTIMATED BLOOD LOSS:  Nil.  COMPLICATIONS: None.  SPECIMENS:  Bilateral testes, discard.  FINDINGS:  Unremarkable bilateral testes.  DRAIN:  Penrose drain to wound drainage.  INDICATIONS:  The patient is a 34 year old individual with a longstanding history of desire for male-to-male transgender transition. He had been on hormones for over 5 years and resolute the decision for years.  As part of the transition, they desire  permanent androgen deprivation and for cosmetic, economic, and physiologic reasons, they desired bilateral simple orchiectomy.  Evaluated and felt to be a suitable candidate of sound mind, resolute in his decision, and wished to proceed.  Informed  consent was obtained and placed in the medical record.  DESCRIPTION OF PROCEDURE:  The patient being Austin Vargas being verified and procedure being bilateral simple orchiectomy was confirmed.  A procedure timeout was performed.  Intravenous antibiotics administered.  General endotracheal anesthesia was  induced.  The patient was placed into a supine position.  Sterile field was created by first clippers shaving and then prepped and draped the patient's penis, perineum, proximal thighs and scrotum using iodine.  Next, an incision was made approximately 4 cm  in length along the median raphe of the anterior-inferior scrotum.  Then, entering towards the left hemiscrotal compartment, dartos layers were dissected and the left testis was delivered, dissected away  from its tunics, and isolated on its cord.  The  cord was triply tied with silk ties and transected with excellent hemostasis, leaving a minimal stump distal to the external ring.  Hemostasis was excellent.  The cord was purposely allowed to retract upward beyond this.  Additional hemostasis was achieved  in the left scrotal compartment using point cautery dissection.  Next, the right hemiscrotal compartment was entered via the same incision.  Again, dissection proceeded down to the tunics to the level of the testis, which was grossly unremarkable.   Again, a relatively high ligation was performed after triply ligating with silk ties. Hemostasis was excellent.  The cord was allowed to retract upwards. At that point, coagulation current was used to obtain excellent hemostasis of the right hemiscrotum.   Next, a small 1/2-inch counter incision was made deep in the scrotum through which a 1/4 inch Penrose drain was placed such that it traversed both scrotal compartments.  The dartos was reapproximated using running Vicryl.  Skin was reapproximated using  running Monocryl. 5 mL of 0.25% Marcaine was placed percutaneously into each proximal cord stump.  An additional 5 mL in the area of the incision.  Procedure was terminated.  The patient tolerated the procedure well.  No immediate periprocedural  complications.  The patient was taken in postanesthesia care unit in stable condition with plan to discharge home.   NIK D: 08/01/2024 4:10:00 pm T: 08/01/2024 9:54:00 pm  JOB: [id number]/ [id number]

## 2024-08-01 NOTE — Discharge Instructions (Addendum)
 1 - You may have small scrotal swelling / bruising and some thin bloody drain output with drain in place. This is normal.  2 - Call MD or go to ER for fever >102, severe pain / nausea / vomiting not relieved by medications, or acute change in medical status

## 2024-08-01 NOTE — Transfer of Care (Signed)
 Immediate Anesthesia Transfer of Care Note  Patient: Austin Vargas  Procedure(s) Performed: ORCHIECTOMY (Bilateral: Scrotum)  Patient Location: PACU  Anesthesia Type:General  Level of Consciousness: sedated  Airway & Oxygen Therapy: Patient Spontanous Breathing and Patient connected to face mask oxygen  Post-op Assessment: Report given to RN and Post -op Vital signs reviewed and stable  Post vital signs: Reviewed and stable  Last Vitals:  Vitals Value Taken Time  BP 107/68 08/01/24 16:11  Temp    Pulse 81 08/01/24 16:13  Resp 19 08/01/24 16:13  SpO2 100 % 08/01/24 16:13  Vitals shown include unfiled device data.  Last Pain:  Vitals:   08/01/24 1405  TempSrc:   PainSc: 7          Complications: No notable events documented.

## 2024-08-01 NOTE — H&P (Signed)
 Austin Vargas is an 33 y.o. adult.    Chief Complaint: Pre-Op Bilateral Orchiectomy  HPI:   1 - Male to Male Transgender / Request for Permanent Androgen Ablation - biologic male on spironolactone + estradiol for >5 years managed by [facility] for androgen ablation as part of gender transition.  PMH sig for syncope/non-cardiac chest pain (exhaustive negative prior eval), appy. Works at [employer]. His PCP is Heron Fear at [place].  Today  Austin Vargas  is seen to proceed with bilateral orchiectomy for permanent androgen ablation. No interval fevers. Hgb 15, Cr 0.5 most recently.   Past Medical History:  Diagnosis Date   ADHD (attention deficit hyperactivity disorder)    Allergy    Anxiety    Bicuspid aortic valve    Depression    GERD (gastroesophageal reflux disease)    GERD (gastroesophageal reflux disease)    Heart murmur    Patent foramen ovale    Syncope -neurocardiogenic    He underwent extensive evaluation by [facility]. This included an ECG, Holter monitor, echocardiogram, cranial CT scan, and tilt table testing. His echocardiogram was remarkable for a PFO by bubble study. He also appeared to have a bicuspid aortic valve but had no significant gradient.  Holter neg for significant arrhythmias. Tilt test + for neurocardiogenic syncope    Past Surgical History:  Procedure Laterality Date   APPENDECTOMY     EYE SURGERY Bilateral     Family History  Problem Relation Age of Onset   Heart disease Maternal Grandmother    Breast cancer Other        maternal great grandmother   Diabetes Paternal Grandmother    Heart disease Other        maternal great grandmother   Colon cancer Neg Hx    Throat cancer Neg Hx    Esophageal cancer Neg Hx    Rectal cancer Neg Hx    Stomach cancer Neg Hx    Social History:  reports that he has never smoked. He has never used smokeless tobacco. He reports current alcohol use of about 1.0 standard drink of  alcohol per week. He reports that he does not use drugs.  Allergies:  Allergies  Allergen Reactions   Hydrocodone-Acetaminophen  Other (See Comments)    shakes chest pain, dizziness     Paxil [Paroxetine Hcl] Anaphylaxis    hallucinations   Prednisone  Palpitations, Rash and Other (See Comments)    chest pain   Latex Itching and Other (See Comments)    Itching/redness    No medications prior to admission.    No results found for this or any previous visit (from the past 48 hours). No results found.  Review of Systems  Constitutional:  Negative for chills and fever.  All other systems reviewed and are negative.   There were no vitals taken for this visit. Physical Exam Vitals reviewed.  HENT:     Head: Normocephalic.  Eyes:     Pupils: Pupils are equal, round, and reactive to light.  Cardiovascular:     Rate and Rhythm: Normal rate.  Pulmonary:     Effort: Pulmonary effort is normal.  Abdominal:     General: Abdomen is flat.  Genitourinary:    Comments: No CVAT Musculoskeletal:        General: Normal range of motion.     Cervical back: Normal range of motion.  Skin:    General: Skin is warm.  Neurological:     General: No focal  deficit present.     Mental Status: He is alert.  Psychiatric:        Mood and Affect: Mood normal.      Assessment/Plan  Proceed as planned with bilateral simple orchiectomy. Risks, benefits, alternatives, expected peri-op course discussed previously and reiterated today. He has very good understanding of the permanent infertility and irreversible nature of this surgery and is resolute in his decision with years of consistent desire for permanent androgen ablation.   Ricardo KATHEE Alvaro Mickey., MD 08/01/2024, 6:38 AM

## 2024-08-01 NOTE — Brief Op Note (Signed)
 08/01/2024  4:04 PM  PATIENT:  Austin Vargas  33 y.o. adult  PRE-OPERATIVE DIAGNOSIS:  BILATERAL ABNORMAL HORMONE LEVELS  POST-OPERATIVE DIAGNOSIS:  BILATERAL ABNORMAL HORMONE LEVELS  PROCEDURE:  Procedure(s): ORCHIECTOMY (Bilateral)  SURGEON:  Surgeons and Role:    * Manny, Ricardo KATHEE Raddle., MD - Primary  PHYSICIAN ASSISTANT:   ASSISTANTS: none   ANESTHESIA:   local and general  EBL:  minimal   BLOOD ADMINISTERED:none  DRAINS: Penrose drain in the dependant scrotum   LOCAL MEDICATIONS USED:  MARCAINE     SPECIMEN:  Source of Specimen:  bilateral testes for discard  DISPOSITION OF SPECIMEN:  discard  COUNTS:  YES  TOURNIQUET:  * No tourniquets in log *  DICTATION: .Other Dictation: Dictation Number [id number]  PLAN OF CARE: Discharge to home after PACU  PATIENT DISPOSITION:  PACU - hemodynamically stable.   Delay start of Pharmacological VTE agent (>24hrs) due to surgical blood loss or risk of bleeding: yes

## 2024-08-02 ENCOUNTER — Encounter (HOSPITAL_COMMUNITY): Payer: Self-pay | Admitting: Urology
